# Patient Record
Sex: Female | Born: 1940 | Race: Black or African American | Hispanic: No | Smoking: Never smoker
Health system: Southern US, Community
[De-identification: ages and names within clinical notes are randomized; demographics above are authoritative.]

## PROBLEM LIST (undated history)

## (undated) DIAGNOSIS — M810 Age-related osteoporosis without current pathological fracture: Secondary | ICD-10-CM

## (undated) DIAGNOSIS — I10 Essential (primary) hypertension: Secondary | ICD-10-CM

## (undated) DIAGNOSIS — R202 Paresthesia of skin: Secondary | ICD-10-CM

## (undated) DIAGNOSIS — K219 Gastro-esophageal reflux disease without esophagitis: Secondary | ICD-10-CM

## (undated) HISTORY — DX: Essential (primary) hypertension: I10

## (undated) HISTORY — DX: Paresthesia of skin: R20.2

## (undated) HISTORY — DX: Gastro-esophageal reflux disease without esophagitis: K21.9

## (undated) HISTORY — PX: CATARACT EXTRACTION: SUR2

## (undated) HISTORY — DX: Age-related osteoporosis without current pathological fracture: M81.0

---

## 2005-07-17 ENCOUNTER — Encounter (INDEPENDENT_AMBULATORY_CARE_PROVIDER_SITE_OTHER): Payer: Self-pay | Admitting: Family Medicine

## 2005-07-17 LAB — CONVERTED CEMR LAB

## 2006-07-06 ENCOUNTER — Ambulatory Visit: Payer: Self-pay | Admitting: Family Medicine

## 2006-07-06 LAB — CONVERTED CEMR LAB

## 2006-07-31 ENCOUNTER — Ambulatory Visit (HOSPITAL_COMMUNITY): Admission: RE | Admit: 2006-07-31 | Discharge: 2006-07-31 | Payer: Self-pay | Admitting: Family Medicine

## 2006-11-27 ENCOUNTER — Ambulatory Visit: Payer: Self-pay | Admitting: Family Medicine

## 2007-01-25 ENCOUNTER — Encounter: Payer: Self-pay | Admitting: Family Medicine

## 2007-01-25 DIAGNOSIS — I10 Essential (primary) hypertension: Secondary | ICD-10-CM | POA: Insufficient documentation

## 2007-01-25 DIAGNOSIS — K219 Gastro-esophageal reflux disease without esophagitis: Secondary | ICD-10-CM

## 2007-03-01 DIAGNOSIS — R011 Cardiac murmur, unspecified: Secondary | ICD-10-CM | POA: Insufficient documentation

## 2007-04-01 ENCOUNTER — Ambulatory Visit: Payer: Self-pay | Admitting: Family Medicine

## 2007-08-23 ENCOUNTER — Ambulatory Visit: Payer: Self-pay | Admitting: Internal Medicine

## 2007-08-23 ENCOUNTER — Encounter (INDEPENDENT_AMBULATORY_CARE_PROVIDER_SITE_OTHER): Payer: Self-pay | Admitting: Family Medicine

## 2007-08-23 LAB — CONVERTED CEMR LAB
CO2: 28 meq/L (ref 19–32)
Calcium: 9.5 mg/dL (ref 8.4–10.5)
Chloride: 102 meq/L (ref 96–112)
Creatinine, Ser: 0.76 mg/dL (ref 0.40–1.20)
Glucose, Bld: 88 mg/dL (ref 70–99)

## 2007-08-26 ENCOUNTER — Ambulatory Visit (HOSPITAL_COMMUNITY): Admission: RE | Admit: 2007-08-26 | Discharge: 2007-08-26 | Payer: Self-pay | Admitting: Family Medicine

## 2007-10-19 ENCOUNTER — Ambulatory Visit: Payer: Self-pay | Admitting: Internal Medicine

## 2008-01-13 ENCOUNTER — Encounter (INDEPENDENT_AMBULATORY_CARE_PROVIDER_SITE_OTHER): Payer: Self-pay | Admitting: Family Medicine

## 2008-01-13 ENCOUNTER — Ambulatory Visit: Payer: Self-pay | Admitting: Internal Medicine

## 2008-01-13 LAB — CONVERTED CEMR LAB
Free T4: 1.18 ng/dL (ref 0.89–1.80)
T3 Uptake Ratio: 37 % (ref 22.5–37.0)
TSH: 0.847 microintl units/mL (ref 0.350–4.50)

## 2008-09-05 ENCOUNTER — Ambulatory Visit: Payer: Self-pay | Admitting: Family Medicine

## 2008-09-06 ENCOUNTER — Encounter (INDEPENDENT_AMBULATORY_CARE_PROVIDER_SITE_OTHER): Payer: Self-pay | Admitting: Family Medicine

## 2008-09-06 LAB — CONVERTED CEMR LAB
ALT: 15 units/L (ref 0–35)
Albumin: 4.4 g/dL (ref 3.5–5.2)
Alkaline Phosphatase: 66 units/L (ref 39–117)
CO2: 29 meq/L (ref 19–32)
Glucose, Bld: 72 mg/dL (ref 70–99)
Potassium: 3.9 meq/L (ref 3.5–5.3)
Sodium: 142 meq/L (ref 135–145)
Total Bilirubin: 0.3 mg/dL (ref 0.3–1.2)
Total Protein: 7.3 g/dL (ref 6.0–8.3)

## 2009-06-14 ENCOUNTER — Ambulatory Visit: Payer: Self-pay | Admitting: Family Medicine

## 2009-10-12 ENCOUNTER — Ambulatory Visit: Payer: Self-pay | Admitting: Family Medicine

## 2011-01-24 ENCOUNTER — Encounter: Payer: Self-pay | Admitting: *Deleted

## 2011-01-24 ENCOUNTER — Encounter: Payer: Self-pay | Admitting: Cardiology

## 2011-01-24 ENCOUNTER — Ambulatory Visit (INDEPENDENT_AMBULATORY_CARE_PROVIDER_SITE_OTHER): Payer: Medicare HMO | Admitting: Cardiology

## 2011-01-24 DIAGNOSIS — I10 Essential (primary) hypertension: Secondary | ICD-10-CM

## 2011-01-24 DIAGNOSIS — R011 Cardiac murmur, unspecified: Secondary | ICD-10-CM

## 2011-01-24 DIAGNOSIS — R079 Chest pain, unspecified: Secondary | ICD-10-CM | POA: Insufficient documentation

## 2011-01-24 NOTE — Assessment & Plan Note (Signed)
Probable ejection murmur.

## 2011-01-24 NOTE — Assessment & Plan Note (Signed)
Blood pressure controlled. Continue present medications. 

## 2011-01-24 NOTE — Progress Notes (Signed)
HPI: 70 year old female with no prior cardiac history for evaluation of chest pain. Patient states she has had intermittent chest pain for approximately one year. It is under the left breast and left axilla. It increases with lying on her left side. It is described as a sharp pain. It can last 30 minutes at a time. It does not radiate. It is not pleuritic, related to food or exertion. No associated symptoms. She denies dyspnea on exertion, orthopnea, PND, palpitations, syncope or exertional chest pain. Occasional mild pedal edema. Because of the above we were asked to further evaluate.  Current Outpatient Prescriptions  Medication Sig Dispense Refill  . aspirin 81 MG tablet Take 81 mg by mouth daily.        . carvedilol (COREG) 3.125 MG tablet Take 3.125 mg by mouth 2 (two) times daily with a meal.        . co-enzyme Q-10 30 MG capsule Take 30 mg by mouth 2 (two) times daily.        . Multiple Vitamins-Minerals (MULTIVITAMIN WITH MINERALS) tablet Take 1 tablet by mouth daily.        . nitroGLYCERIN (NITROSTAT) 0.4 MG SL tablet Place 0.4 mg under the tongue every 5 (five) minutes as needed.          No Known Allergies  Past Medical History  Diagnosis Date  . HYPERTENSION   . GERD     Past Surgical History  Procedure Date  . Cataract extraction     History   Social History  . Marital Status: Divorced    Spouse Name: N/A    Number of Children: 3  . Years of Education: N/A   Occupational History  .      retired   Social History Main Topics  . Smoking status: Never Smoker   . Smokeless tobacco: Not on file  . Alcohol Use: No  . Drug Use: Not on file  . Sexually Active: Not on file   Other Topics Concern  . Not on file   Social History Narrative  . No narrative on file    Family History  Problem Relation Age of Onset  . Heart attack Mother     Died at age 19 of MI  . Lung cancer Father     ROS: no fevers or chills, productive cough, hemoptysis, dysphasia,  odynophagia, melena, hematochezia, dysuria, hematuria, rash, seizure activity, orthopnea, PND, pedal edema, claudication. Remaining systems are negative.  Physical Exam: General:  Well developed/well nourished in NAD Skin warm/dry Patient not depressed No peripheral clubbing Back-normal HEENT-normal/normal eyelids Neck supple/normal carotid upstroke bilaterally; no bruits; no JVD; no thyromegaly chest - CTA/ normal expansion CV - RRR/normal S1 and S2; no  rubs or gallops;  PMI nondisplaced; 2/6 systolic murmur left sternal border. Abdomen -NT/ND, no HSM, no mass, + bowel sounds, no bruit 2+ femoral pulses, no bruits Ext-trace edema, no chords, 2+ DP Neuro-grossly nonfocal  ECG 01/23/11 - Sinus rhythm, LAD LVH, Nonspecific ST changes

## 2011-01-24 NOTE — Assessment & Plan Note (Signed)
Symptoms atypical. Schedule stress echo. May be musculoskeletal.

## 2011-01-24 NOTE — Patient Instructions (Signed)
Your physician recommends that you schedule a follow-up appointment in: AS NEEDED  Your physician recommends that you continue on your current medications as directed. Please refer to the Current Medication list given to you today.  Your physician has requested that you have a stress echocardiogram. For further information please visit [email or web address]. Please follow instruction sheet as given. DX CHEST PAIN  

## 2011-02-04 ENCOUNTER — Ambulatory Visit (HOSPITAL_BASED_OUTPATIENT_CLINIC_OR_DEPARTMENT_OTHER): Payer: Medicare HMO | Admitting: Radiology

## 2011-02-04 ENCOUNTER — Ambulatory Visit (HOSPITAL_COMMUNITY): Payer: Medicare HMO | Attending: Cardiology | Admitting: Radiology

## 2011-02-04 DIAGNOSIS — R072 Precordial pain: Secondary | ICD-10-CM

## 2011-02-04 DIAGNOSIS — R5383 Other fatigue: Secondary | ICD-10-CM | POA: Insufficient documentation

## 2011-02-04 DIAGNOSIS — R5381 Other malaise: Secondary | ICD-10-CM | POA: Insufficient documentation

## 2011-02-04 DIAGNOSIS — R0989 Other specified symptoms and signs involving the circulatory and respiratory systems: Secondary | ICD-10-CM

## 2011-02-04 DIAGNOSIS — I10 Essential (primary) hypertension: Secondary | ICD-10-CM | POA: Insufficient documentation

## 2011-08-04 ENCOUNTER — Encounter (HOSPITAL_COMMUNITY): Payer: Self-pay

## 2011-08-04 ENCOUNTER — Emergency Department (HOSPITAL_COMMUNITY)
Admission: EM | Admit: 2011-08-04 | Discharge: 2011-08-04 | Disposition: A | Discharge status: Home / Self Care | Payer: Medicare HMO | Attending: Emergency Medicine | Admitting: Emergency Medicine

## 2011-08-04 DIAGNOSIS — L02519 Cutaneous abscess of unspecified hand: Secondary | ICD-10-CM | POA: Insufficient documentation

## 2011-08-04 DIAGNOSIS — L03019 Cellulitis of unspecified finger: Secondary | ICD-10-CM | POA: Insufficient documentation

## 2011-08-04 DIAGNOSIS — L0291 Cutaneous abscess, unspecified: Secondary | ICD-10-CM

## 2011-08-04 DIAGNOSIS — M79609 Pain in unspecified limb: Secondary | ICD-10-CM | POA: Insufficient documentation

## 2011-08-04 DIAGNOSIS — I10 Essential (primary) hypertension: Secondary | ICD-10-CM | POA: Insufficient documentation

## 2011-08-04 MED ORDER — CEPHALEXIN 250 MG PO CAPS
500.0000 mg | ORAL_CAPSULE | Freq: Once | ORAL | Status: AC
Start: 2011-08-04 — End: 2011-08-04
  Administered 2011-08-04: 500 mg via ORAL
  Filled 2011-08-04: qty 2

## 2011-08-04 MED ORDER — CEPHALEXIN 500 MG PO CAPS: 500.0000 mg | ORAL_CAPSULE | Freq: Four times a day (QID) | ORAL | Status: DC

## 2011-08-04 NOTE — Discharge Instructions (Signed)
Keep your wound clean and dry.  He may wash with soap and water.  Use Keflex for infection and Tylenol or Motrin to reduce pain and swelling.  Followup to Dr. as needed.  Return for worse or uncontrolled symptoms

## 2011-08-04 NOTE — ED Provider Notes (Signed)
History     CSN: [id number]  Arrival date & time 08/04/11  4098   First MD Initiated Contact with Patient 08/04/11 1137      Chief Complaint  Patient presents with  . Finger Injury    (Consider location/radiation/quality/duration/timing/severity/associated sxs/prior treatment) The history is provided by the patient.   the patient is a 71 year old, right-hand dominant female, with a history of hypertension, who presents emergency department with right thumb pain and swelling for several days.  She denies trauma.  She washes dishes by hand, but uses gloves.  She has not had fevers, chills, nausea, vomiting.  Past Medical History  Diagnosis Date  . HYPERTENSION   . GERD     Past Surgical History  Procedure Date  . Cataract extraction     Family History  Problem Relation Age of Onset  . Heart attack Mother     Died at age 9 of MI  . Lung cancer Father     History  Substance Use Topics  . Smoking status: Never Smoker   . Smokeless tobacco: Not on file  . Alcohol Use: No    OB History    Grav Para Term Preterm Abortions TAB SAB Ect Mult Living                  Review of Systems  Constitutional: Negative for fever and chills.  Gastrointestinal: Negative for nausea and vomiting.  Musculoskeletal:       Right thumb pain, and  Skin: Negative for rash.  Neurological: Negative for weakness.    Allergies  Review of patient's allergies indicates no known allergies.  Home Medications   Current Outpatient Rx  Name Route Sig Dispense Refill  . IBUPROFEN 200 MG PO TABS Oral Take 200 mg by mouth every 6 (six) hours as needed. For pain    . LISINOPRIL-HYDROCHLOROTHIAZIDE 20-25 MG PO TABS Oral Take 1 tablet by mouth daily.    Marland Kitchen NITROGLYCERIN 0.4 MG SL SUBL Sublingual Place 0.4 mg under the tongue every 5 (five) minutes as needed. For chest pain      BP 151/82  Pulse 80  Temp(Src) 97.8 F (36.6 C) (Oral)  Resp 20  SpO2 98%  Physical Exam  Vitals  reviewed. Constitutional: She appears well-developed and well-nourished.  HENT:  Head: Normocephalic and atraumatic.  Eyes: Conjunctivae are normal. Pupils are equal, round, and reactive to light.  Neck: Normal range of motion. Neck supple.  Pulmonary/Chest: Effort normal.  Musculoskeletal: She exhibits edema and tenderness.       Right thumb with boggy swelling and tenderness just proximal to the root of the nail between the IP joint and the nail.  There is no tenderness to the volar surface of the thumb.  Neurological: She is alert.  Skin: Skin is warm and dry. No erythema.  Psychiatric: She has a normal mood and affect. Thought content normal.    ED Course  Procedures (including critical care time) Abscess to the dorsum of the right thumb.  I anesthetized her with 2% lidocaine without epinephrine, and made an incision with an 18-gauge needle and expressed a small amount of pus.  The patient tolerated the procedure well.  Labs Reviewed - No data to display No results found.   No diagnosis found.    MDM  Right thumb abscess, drained in the emergency department, tolerated well by the patient        Cheri Guppy, MD 08/04/11 1209

## 2011-08-04 NOTE — ED Notes (Signed)
Onset 4 days ago right thumb and right hand swelling increases then improved however swelling right thumb +2 radial pulses +2 strong pain throbbing 7-10/10.  Patient has ring on 4th finger instructed patient to removed ring. Removed without incident.  Small tiny blister distal thumb below nail bed no drainage present patient stated have been then present for one year.

## 2011-08-04 NOTE — ED Notes (Signed)
Left thumb swelling and painful, redness noted.

## 2011-08-07 ENCOUNTER — Encounter (HOSPITAL_COMMUNITY): Payer: Self-pay | Admitting: Emergency Medicine

## 2011-08-07 ENCOUNTER — Emergency Department (HOSPITAL_COMMUNITY)
Admission: EM | Admit: 2011-08-07 | Discharge: 2011-08-07 | Discharge status: Against Medical Advice | Payer: Medicare HMO | Attending: Emergency Medicine | Admitting: Emergency Medicine

## 2011-08-07 DIAGNOSIS — M79609 Pain in unspecified limb: Secondary | ICD-10-CM | POA: Insufficient documentation

## 2011-08-07 NOTE — ED Notes (Signed)
Pt states she is leaving, encouraged pt to stay and be seen.  Pt states she has appt on Monday.

## 2011-08-07 NOTE — ED Notes (Signed)
Called patient x 3 or 4 times and there was no answer. Nurse is aware

## 2011-08-07 NOTE — ED Notes (Signed)
Was seen here on Monday for infection on rt thumb cannot get appointment fot a while and needs a recheck has taken her meds as she should

## 2012-11-23 ENCOUNTER — Encounter: Payer: Self-pay | Admitting: Internal Medicine

## 2012-11-25 ENCOUNTER — Other Ambulatory Visit (HOSPITAL_COMMUNITY): Payer: Self-pay | Admitting: Obstetrics

## 2012-11-25 ENCOUNTER — Other Ambulatory Visit (HOSPITAL_COMMUNITY): Payer: Self-pay | Admitting: Family Medicine

## 2012-11-25 DIAGNOSIS — Z1231 Encounter for screening mammogram for malignant neoplasm of breast: Secondary | ICD-10-CM

## 2012-11-25 DIAGNOSIS — M810 Age-related osteoporosis without current pathological fracture: Secondary | ICD-10-CM

## 2012-12-21 ENCOUNTER — Ambulatory Visit (HOSPITAL_COMMUNITY)
Admission: RE | Admit: 2012-12-21 | Discharge: 2012-12-21 | Disposition: A | Discharge status: Home / Self Care | Payer: Medicare HMO | Source: Ambulatory Visit | Attending: Family Medicine | Admitting: Family Medicine

## 2012-12-21 DIAGNOSIS — Z13820 Encounter for screening for osteoporosis: Secondary | ICD-10-CM | POA: Insufficient documentation

## 2012-12-21 DIAGNOSIS — Z1231 Encounter for screening mammogram for malignant neoplasm of breast: Secondary | ICD-10-CM

## 2012-12-21 DIAGNOSIS — M810 Age-related osteoporosis without current pathological fracture: Secondary | ICD-10-CM

## 2012-12-21 DIAGNOSIS — Z780 Asymptomatic menopausal state: Secondary | ICD-10-CM | POA: Insufficient documentation

## 2012-12-22 ENCOUNTER — Other Ambulatory Visit: Payer: Self-pay | Admitting: Family Medicine

## 2012-12-22 DIAGNOSIS — R928 Other abnormal and inconclusive findings on diagnostic imaging of breast: Secondary | ICD-10-CM

## 2013-01-06 ENCOUNTER — Other Ambulatory Visit: Payer: Self-pay | Admitting: Cardiology

## 2013-01-07 ENCOUNTER — Ambulatory Visit
Admission: RE | Admit: 2013-01-07 | Discharge: 2013-01-07 | Disposition: A | Discharge status: Home / Self Care | Payer: Medicare HMO | Source: Ambulatory Visit | Attending: Family Medicine | Admitting: Family Medicine

## 2013-01-07 ENCOUNTER — Other Ambulatory Visit: Payer: Self-pay | Admitting: Family Medicine

## 2013-01-07 DIAGNOSIS — R928 Other abnormal and inconclusive findings on diagnostic imaging of breast: Secondary | ICD-10-CM

## 2013-01-13 ENCOUNTER — Ambulatory Visit (AMBULATORY_SURGERY_CENTER): Payer: Medicare HMO

## 2013-01-13 VITALS — Ht 62.0 in | Wt 123.0 lb

## 2013-01-13 DIAGNOSIS — Z1211 Encounter for screening for malignant neoplasm of colon: Secondary | ICD-10-CM

## 2013-01-13 MED ORDER — MOVIPREP 100 G PO SOLR: 1.0000 | Freq: Once | ORAL | Status: DC

## 2013-01-14 ENCOUNTER — Encounter: Payer: Self-pay | Admitting: Internal Medicine

## 2013-01-26 ENCOUNTER — Telehealth: Payer: Self-pay | Admitting: Internal Medicine

## 2013-01-27 ENCOUNTER — Encounter: Payer: Medicare HMO | Admitting: Internal Medicine

## 2013-01-27 NOTE — Telephone Encounter (Signed)
Yes, charge cancel fee

## 2013-01-27 NOTE — Telephone Encounter (Signed)
Charge cancellation fee per Dr Rhea Belton.

## 2013-01-28 ENCOUNTER — Encounter: Payer: Self-pay | Admitting: Internal Medicine

## 2013-02-02 NOTE — Telephone Encounter (Signed)
Unable to [employer] Fee due to her Insurance/yf

## 2013-08-22 ENCOUNTER — Ambulatory Visit: Payer: Self-pay | Admitting: Family Medicine

## 2013-09-07 ENCOUNTER — Ambulatory Visit: Payer: Commercial Managed Care - HMO

## 2013-09-07 ENCOUNTER — Ambulatory Visit: Payer: Commercial Managed Care - HMO | Admitting: Family Medicine

## 2013-09-07 VITALS — BP 160/88 | HR 74 | Temp 98.3°F | Resp 16 | Ht 59.0 in | Wt 121.4 lb

## 2013-09-07 DIAGNOSIS — R062 Wheezing: Secondary | ICD-10-CM

## 2013-09-07 DIAGNOSIS — R05 Cough: Secondary | ICD-10-CM

## 2013-09-07 DIAGNOSIS — R059 Cough, unspecified: Secondary | ICD-10-CM

## 2013-09-07 DIAGNOSIS — I10 Essential (primary) hypertension: Secondary | ICD-10-CM

## 2013-09-07 DIAGNOSIS — J45909 Unspecified asthma, uncomplicated: Secondary | ICD-10-CM

## 2013-09-07 MED ORDER — AZITHROMYCIN 250 MG PO TABS: ORAL_TABLET | ORAL | Status: DC

## 2013-09-07 MED ORDER — BENZONATATE 100 MG PO CAPS: 100.0000 mg | ORAL_CAPSULE | Freq: Three times a day (TID) | ORAL | Status: DC | PRN

## 2013-09-07 MED ORDER — ALBUTEROL SULFATE (2.5 MG/3ML) 0.083% IN NEBU
2.5000 mg | INHALATION_SOLUTION | Freq: Once | RESPIRATORY_TRACT | Status: AC
  Administered 2013-09-07: 2.5 mg via RESPIRATORY_TRACT

## 2013-09-07 MED ORDER — ALBUTEROL SULFATE HFA 108 (90 BASE) MCG/ACT IN AERS: 2.0000 | INHALATION_SPRAY | Freq: Four times a day (QID) | RESPIRATORY_TRACT | Status: DC | PRN

## 2013-09-07 NOTE — Progress Notes (Signed)
Subjective: Patient comes in today with a one-month history of cough and bringing up green and yellow phlegm. She has persisted in having problems, only feeling a little better the last few days. She was concerned about her blood pressure medicine being the cause of cough. She does not smoke. The coughing is been all day long. She does not have a history of a lot of allergies.  Objective: Blood pressure is little up she did quit her medicines last few days to see about getting rid of her cough apparently. Her TMs are normal. Throat clear. Neck supple without significant nodes. Chest has diffuse rhonchi and wheezing, more on the left than the right. Heart regular without murmur.  Assessment: Bronchitis Cough Wheezing Hypertension  Plan: Albuterol Chest x-ray  [facility] reading (PRIMARY) by  Dr. Alwyn RenHopper No acute changes.

## 2013-09-07 NOTE — Patient Instructions (Addendum)
Drink plenty of fluids  Continue current blood pressure medicine  Use the inhaler 2 inhalations every 4-6 hours as needed for coughing and wheezing  Take the cough pills one or 2 pills 3 times daily as needed for cough  Return if worse or not improving

## 2013-12-13 ENCOUNTER — Ambulatory Visit (INDEPENDENT_AMBULATORY_CARE_PROVIDER_SITE_OTHER): Payer: Commercial Managed Care - HMO | Admitting: Emergency Medicine

## 2013-12-13 VITALS — BP 138/86 | HR 58 | Temp 97.9°F | Resp 16 | Ht 61.5 in | Wt 122.4 lb

## 2013-12-13 DIAGNOSIS — G5600 Carpal tunnel syndrome, unspecified upper limb: Secondary | ICD-10-CM

## 2013-12-13 DIAGNOSIS — G5602 Carpal tunnel syndrome, left upper limb: Secondary | ICD-10-CM

## 2013-12-13 NOTE — Patient Instructions (Signed)
Carpal Tunnel Syndrome The carpal tunnel is a narrow area located on the palm side of your wrist. The tunnel is formed by the wrist bones and ligaments. Nerves, blood vessels, and tendons pass through the carpal tunnel. Repeated wrist motion or certain diseases may cause swelling within the tunnel. This swelling pinches the main nerve in the wrist (median nerve) and causes the painful hand and arm condition called carpal tunnel syndrome. CAUSES   Repeated wrist motions.  Wrist injuries.  Certain diseases like arthritis, diabetes, alcoholism, hyperthyroidism, and kidney failure.  Obesity.  Pregnancy. SYMPTOMS   A "pins and needles" feeling in your fingers or hand, especially in your thumb, index and middle fingers.  Tingling or numbness in your fingers or hand.  An aching feeling in your entire arm, especially when your wrist and elbow are bent for long periods of time.  Wrist pain that goes up your arm to your shoulder.  Pain that goes down into your palm or fingers.  A weak feeling in your hands. DIAGNOSIS  Your health care provider will take your history and perform a physical exam. An electromyography test may be needed. This test measures electrical signals sent out by your nerves into the muscles. The electrical signals are usually slowed by carpal tunnel syndrome. You may also need X-rays. TREATMENT  Carpal tunnel syndrome may clear up by itself. Your health care provider may recommend a wrist splint or medicine such as a nonsteroidal anti-inflammatory medicine. Cortisone injections may help. Sometimes, surgery may be needed to free the pinched nerve.  HOME CARE INSTRUCTIONS   Take all medicine as directed by your health care provider. Only take over-the-counter or prescription medicines for pain, discomfort, or fever as directed by your health care provider.  If you were given a splint to keep your wrist from bending, wear it as directed. It is important to wear the splint at  night. Wear the splint for as long as you have pain or numbness in your hand, arm, or wrist. This may take 1 to 2 months.  Rest your wrist from any activity that may be causing your pain. If your symptoms are work-related, you may need to talk to your employer about changing to a job that does not require using your wrist.  Put ice on your wrist after long periods of wrist activity.  Put ice in a plastic bag.  Place a towel between your skin and the bag.  Leave the ice on for 15-20 minutes, 03-04 times a day.  Keep all follow-up visits as directed by your health care provider. This includes any orthopedic referrals, physical therapy, and rehabilitation. Any delay in getting necessary care could result in a delay or failure of your condition to heal. SEEK IMMEDIATE MEDICAL CARE IF:   You have new, unexplained symptoms.  Your symptoms get worse and are not helped or controlled with medicines. MAKE SURE YOU:   Understand these instructions.  Will watch your condition.  Will get help right away if you are not doing well or get worse. Document Released: 05/02/2000 Document Revised: 09/19/2013 Document Reviewed: 03/21/2011 Exit[employer] Patient Information 2015 [employer]. This information is not intended to replace advice given to you by your health care provider. Make sure you discuss any questions you have with your health care provider.  

## 2013-12-13 NOTE — Progress Notes (Signed)
[facility] [address], [place] [address] [phone]  Date:  12/13/2013   Name:  Gloria Navarro   DOB:  1941-04-28   MRN:  [id number]  PCP:  Becky Sax, MD    Chief Complaint: Numbness and Tingling   History of Present Illness:  Gloria Navarro is a 73 y.o. very pleasant female patient who presents with the following:  Patient has a several month duration numbness in her left hand fingers.  Says initially it only bothered her at night and woke her up with pain and numbness. Now has numbness all of the time.  Says she was forced to give up sewing and knitting as a consequence  No weakness or dropping.  No pain in neck or arm.  No improvement with over the counter medications or other home remedies. Denies other complaint or health concern today.   Patient Active Problem List   Diagnosis Date Noted  . Chest pain 01/24/2011  . Hypertension 01/24/2011  . CARDIAC MURMUR 03/01/2007  . HYPERTENSION 01/25/2007  . GERD 01/25/2007    Past Medical History  Diagnosis Date  . HYPERTENSION   . GERD     Past Surgical History  Procedure Laterality Date  . Cataract extraction      History  Substance Use Topics  . Smoking status: Never Smoker   . Smokeless tobacco: Not on file  . Alcohol Use: No    Family History  Problem Relation Age of Onset  . Heart attack Mother     Died at age 76 of MI  . Lung cancer Father   . Colon cancer Neg Hx   . Stomach cancer Neg Hx     No Known Allergies  Medication list has been reviewed and updated.  Current Outpatient Prescriptions on File Prior to Visit  Medication Sig Dispense Refill  . hydrALAZINE (APRESOLINE) 50 MG tablet Take 50 mg by mouth 3 (three) times daily.      Marland Kitchen albuterol (PROVENTIL HFA;VENTOLIN HFA) 108 (90 BASE) MCG/ACT inhaler Inhale 2 puffs into the lungs every 6 (six) hours as needed for wheezing or shortness of breath.  1 Inhaler  0  . azithromycin (ZITHROMAX) 250 MG tablet Take 2 initially,  then one daily for 4 days for infection  6 tablet  0  . benzonatate (TESSALON) 100 MG capsule Take 1-2 capsules (100-200 mg total) by mouth 3 (three) times daily as needed for cough.  30 capsule  0  . meloxicam (MOBIC) 7.5 MG tablet Take 7.5 mg by mouth as needed for pain.      Marland Kitchen MOVIPREP 100 G SOLR Take 1 kit (200 g total) by mouth once.  1 kit  0  . [DISCONTINUED] carvedilol (COREG) 3.125 MG tablet Take 3.125 mg by mouth 2 (two) times daily with a meal.         No current facility-administered medications on file prior to visit.    Review of Systems:  As per HPI, otherwise negative.    Physical Examination: Filed Vitals:   12/13/13 1329  BP: 138/86  Pulse: 58  Temp: 97.9 F (36.6 C)  Resp: 16   Filed Vitals:   12/13/13 1329  Height: 5' 1.5" (1.562 m)  Weight: 122 lb 6.4 oz (55.52 kg)   Body mass index is 22.76 kg/(m^2). Ideal Body Weight: Weight in (lb) to have BMI = 25: 134.2   GEN: WDWN, NAD, Non-toxic, Alert & Oriented x 3 HEENT: Atraumatic, Normocephalic.  Ears and  Nose: No external deformity. EXTR: No clubbing/cyanosis/edema NEURO: Normal gait.  PSYCH: Normally interactive. Conversant. Not depressed or anxious appearing.  Calm demeanor.  LEFT hand:  Normal grip.  No wasting.  tinnel and phalen positive  Assessment and Plan: Carpal tunnel NCS EMG Night splint  Signed,  Ellison Carwin, MD

## 2013-12-28 ENCOUNTER — Other Ambulatory Visit: Payer: Self-pay

## 2014-02-03 ENCOUNTER — Other Ambulatory Visit: Payer: Self-pay | Admitting: Family Medicine

## 2014-02-27 ENCOUNTER — Ambulatory Visit (INDEPENDENT_AMBULATORY_CARE_PROVIDER_SITE_OTHER): Payer: 59

## 2014-02-27 ENCOUNTER — Ambulatory Visit (INDEPENDENT_AMBULATORY_CARE_PROVIDER_SITE_OTHER): Payer: 59 | Admitting: Family Medicine

## 2014-02-27 VITALS — BP 126/82 | HR 55 | Temp 98.2°F | Resp 16 | Ht 61.75 in | Wt 123.0 lb

## 2014-02-27 DIAGNOSIS — R208 Other disturbances of skin sensation: Secondary | ICD-10-CM

## 2014-02-27 DIAGNOSIS — M25462 Effusion, left knee: Secondary | ICD-10-CM

## 2014-02-27 DIAGNOSIS — R200 Anesthesia of skin: Secondary | ICD-10-CM

## 2014-02-27 DIAGNOSIS — H578 Other specified disorders of eye and adnexa: Secondary | ICD-10-CM

## 2014-02-27 DIAGNOSIS — H5789 Other specified disorders of eye and adnexa: Secondary | ICD-10-CM

## 2014-02-27 MED ORDER — MELOXICAM 7.5 MG PO TABS: 7.5000 mg | ORAL_TABLET | ORAL | Status: DC | PRN

## 2014-02-27 NOTE — Progress Notes (Signed)
[facility] [address] [place] [address] [phone]  Date:  02/27/2014   Name:  Gloria Navarro   DOB:  January 25, 1941   MRN:  [id number]  PCP:  Becky Sax, MD    Chief Complaint: Knee Pain, Left Hand and Red Eyes   History of Present Illness:  Gloria Navarro is a 73 y.o. very pleasant female patient who presents with the following:  Here today with a few concerns.  She has noted left knee pain for a couple of weeks. She cannot remember any particular injury. It just seemed to start on its own after she did a good bit of gardening.  It had been swollen and red, but this is now actually a lot better.  However it does click and pop sometimes.  She has "never" had trouble with her knee in the past.    Also, she has noted numbness in her left hand for several months.  She was seen for this in late July, but seems to think it was related to her BP at that time.  She did not ever use a wrist splint or have an nerve conduction study; it looked like that was the plan at that time.  She notes the numbness more in the 3rd, 4th, 5th fingers.    She also notes redness in her right eye for the last couple of weeks.  No change in vision, no pain or discharge.  Admits that she started using visine daily and has done so for a couple of weeks.   Patient Active Problem List   Diagnosis Date Noted  . Chest pain 01/24/2011  . Hypertension 01/24/2011  . CARDIAC MURMUR 03/01/2007  . HYPERTENSION 01/25/2007  . GERD 01/25/2007    Past Medical History  Diagnosis Date  . HYPERTENSION   . GERD     Past Surgical History  Procedure Laterality Date  . Cataract extraction      History  Substance Use Topics  . Smoking status: Never Smoker   . Smokeless tobacco: Not on file  . Alcohol Use: No    Family History  Problem Relation Age of Onset  . Heart attack Mother     Died at age 78 of MI  . Lung cancer Father   . Colon cancer Neg Hx   . Stomach cancer Neg Hx      No Known Allergies  Medication list has been reviewed and updated.  Current Outpatient Prescriptions on File Prior to Visit  Medication Sig Dispense Refill  . albuterol (PROVENTIL HFA;VENTOLIN HFA) 108 (90 BASE) MCG/ACT inhaler Inhale 2 puffs into the lungs every 6 (six) hours as needed for wheezing or shortness of breath.  1 Inhaler  0  . hydrALAZINE (APRESOLINE) 50 MG tablet Take 50 mg by mouth 3 (three) times daily.      . meloxicam (MOBIC) 7.5 MG tablet Take 7.5 mg by mouth as needed for pain.      Marland Kitchen MOVIPREP 100 G SOLR Take 1 kit (200 g total) by mouth once.  1 kit  0  . azithromycin (ZITHROMAX) 250 MG tablet Take 2 initially, then one daily for 4 days for infection  6 tablet  0  . benzonatate (TESSALON) 100 MG capsule Take 1-2 capsules (100-200 mg total) by mouth 3 (three) times daily as needed for cough.  30 capsule  0  . [DISCONTINUED] carvedilol (COREG) 3.125 MG tablet Take 3.125 mg by mouth 2 (two) times daily with a  meal.         No current facility-administered medications on file prior to visit.    Review of Systems:  As per HPI- otherwise negative.   Physical Examination: Filed Vitals:   02/27/14 1535  BP: 126/82  Pulse: 55  Temp: 98.2 F (36.8 C)  Resp: 16   Filed Vitals:   02/27/14 1535  Height: 5' 1.75" (1.568 m)  Weight: 123 lb (55.792 kg)   Body mass index is 22.69 kg/(m^2). Ideal Body Weight: Weight in (lb) to have BMI = 25: 135.3  GEN: WDWN, NAD, Non-toxic, A & O x 3, older lady who looks well HEENT: Atraumatic, Normocephalic. Neck supple. No masses, No LAD.  Bilateral TM wnl, oropharynx normal.  PEERL,EOMI.   Slight injection of her right eye, normal fundoscopic exam Ears and Nose: No external deformity. CV: RRR, No M/G/R. No JVD. No thrill. No extra heart sounds. PULM: CTA B, no wheezes, crackles, rhonchi. No retractions. No resp. distress. No accessory muscle use. EXTR: No c/c/e NEURO Normal gait.  PSYCH: Normally interactive. Conversant.  Not depressed or anxious appearing.  Calm demeanor.  Left knee: small effusion, full ROM. Mild lateral joint line tenderness. No redness, slight warmth.  Both hands show normal strength, sensation, and perfusion.    [facility] reading (PRIMARY) by  Dr. Lorelei Pont. Left knee: negative except for small effusion.   LEFT KNEE - COMPLETE 4+ VIEW  COMPARISON: None.  FINDINGS: There is no fracture. The patient has a very small joint effusion. Mild patellofemoral degenerative change is noted.  IMPRESSION: No acute finding.  Mild patellofemoral degenerative disease.  Small joint effusion.  Assessment and Plan: Knee effusion, left - Plan: DG Knee Complete 4 Views Left, meloxicam (MOBIC) 7.5 MG tablet  Eye redness  Hand numbness  Counseled that her eye redness is likely due to overuse of visine drops.  She will change to eye moisturizer and natural tears drops.   Hand numbness.  Suspect CTS. Gave a wrist splint to use at night.  If not helpful she will let me know Knee effusion: suspect she injured her knee while gardening.  She declined to have me aspirate her knee today.   She has used mobic in the past- will use this as needed, and wear hinged knee brace as necessary.    Signed Lamar Blinks, MD

## 2014-02-27 NOTE — Patient Instructions (Addendum)
You are likely having some eye redness due to overuse of visine drops.  Stop using any drops "for redness" and use only a dry eye treatment such as genteal dry eye gel or a "natural tears" drops.    Try wearing the wrist brace on your hand at night to see if it may help with your finger numbness.  If it does not please let us know!  Your knee does show some fluid in the joint.  I suspect you injured it somehow when you were gardening.  As long as it is getting better you do not have to do anything further, but if it does continue to bother you let us know.   You can use the meloxicam once a day as needed also

## 2014-03-01 ENCOUNTER — Ambulatory Visit (INDEPENDENT_AMBULATORY_CARE_PROVIDER_SITE_OTHER): Payer: 59 | Admitting: Family Medicine

## 2014-03-01 VITALS — BP 156/88 | HR 59 | Temp 98.2°F | Resp 16 | Ht 61.75 in | Wt 124.0 lb

## 2014-03-01 DIAGNOSIS — M25562 Pain in left knee: Secondary | ICD-10-CM

## 2014-03-01 DIAGNOSIS — M1732 Unilateral post-traumatic osteoarthritis, left knee: Secondary | ICD-10-CM

## 2014-03-01 NOTE — Patient Instructions (Signed)
You received a shot of steroid and numbing medicine into your left knee today.  I hope that this will help your knee to feel better. If you do not notice improvement over the next week or so call and I will refer you to orthopedics.  If you have any sign of infection such as redness, increased pain or swelling please seek care right away!

## 2014-03-01 NOTE — Progress Notes (Signed)
[facility] [address], [place] [address] [phone]  Date:  03/01/2014   Name:  Gloria GuadalajaraRuby Linford   DOB:  03/04/1941   MRN:  [id number]  PCP:  Tommie RaymondWilson, Amelia P, MD    Chief Complaint: Follow-up   History of Present Illness:  Gloria GuadalajaraRuby Mario is a 73 y.o. very pleasant female patient who presents with the following:  She was here 2 days ago with pain in her left knee.  Seemed to result for over-use; gardening.   X-ray showed a small effusion.  Started on mobic and given a hinged knee brace.  She is here today because after consideration she would like to have her knee aspirated and injected with cortisone.  She does not have any allergies to medictions, no diabetes.  She has never had a knee injection or surgery in the past.   She is aware of small risk of infection from any joint injection or aspiration  Patient Active Problem List   Diagnosis Date Noted  . Chest pain 01/24/2011  . Hypertension 01/24/2011  . CARDIAC MURMUR 03/01/2007  . HYPERTENSION 01/25/2007  . GERD 01/25/2007    Past Medical History  Diagnosis Date  . HYPERTENSION   . GERD     Past Surgical History  Procedure Laterality Date  . Cataract extraction      History  Substance Use Topics  . Smoking status: Never Smoker   . Smokeless tobacco: Not on file  . Alcohol Use: No    Family History  Problem Relation Age of Onset  . Heart attack Mother     Died at age 75 of MI  . Lung cancer Father   . Colon cancer Neg Hx   . Stomach cancer Neg Hx     No Known Allergies  Medication list has been reviewed and updated.  Current Outpatient Prescriptions on File Prior to Visit  Medication Sig Dispense Refill  . meloxicam (MOBIC) 7.5 MG tablet Take 1 tablet (7.5 mg total) by mouth as needed for pain.  30 tablet  0  . [DISCONTINUED] carvedilol (COREG) 3.125 MG tablet Take 3.125 mg by mouth 2 (two) times daily with a meal.         No current facility-administered medications on file  prior to visit.    Review of Systems:  As per HPI- otherwise negative.   Physical Examination: Filed Vitals:   03/01/14 1533  BP: 156/88  Pulse: 59  Temp: 98.2 F (36.8 C)  Resp: 16   Filed Vitals:   03/01/14 1533  Height: 5' 1.75" (1.568 m)  Weight: 124 lb (56.246 kg)   Body mass index is 22.88 kg/(m^2). Ideal Body Weight: Weight in (lb) to have BMI = 25: 135.3   GEN: WDWN, NAD, Non-toxic, Alert & Oriented x 3 HEENT: Atraumatic, Normocephalic.  Ears and Nose: No external deformity. EXTR: No clubbing/cyanosis/edema NEURO: Normal gait.  PSYCH: Normally interactive. Conversant. Not depressed or anxious appearing.  Calm demeanor.  Left knee: moderate effusion with medial joint line tenderness.  Normal ROM. No heat or redness  VC obtained.  Left knee prepped with betadine and let dry, then wiped with alcohol.  approx 5ml of 1% lidocaine placed in the track that I planned to use to aspirate the joint.  Then inserted an 18 gauge needle and aspirated 20ml of clear, serous fluid from the knee, then injected 40mg  of depomedrol mixed with 4ml of 1% lidocaine.  Pt tolerated well, no blood loss.  Assessment and Plan: Left knee pain  Post-traumatic osteoarthritis of left knee  Aspirated effusion and placed depo-medrol in painful knee for her today.  She will continue to use her hinged knee brace as needed and will follow-up if sx persist.  Discussed warning signs of infection to watch out for.    Signed Abbe AmsterdamJessica Copland, MD

## 2014-05-03 ENCOUNTER — Ambulatory Visit (INDEPENDENT_AMBULATORY_CARE_PROVIDER_SITE_OTHER): Payer: 59 | Admitting: Family Medicine

## 2014-05-03 VITALS — BP 160/100 | HR 66 | Temp 98.3°F | Resp 18 | Ht 62.0 in | Wt 124.0 lb

## 2014-05-03 DIAGNOSIS — R0789 Other chest pain: Secondary | ICD-10-CM

## 2014-05-03 DIAGNOSIS — I10 Essential (primary) hypertension: Secondary | ICD-10-CM

## 2014-05-03 LAB — TROPONIN I: TROPONIN I: 0.01 ng/mL (ref <0.06)

## 2014-05-03 MED ORDER — LISINOPRIL-HYDROCHLOROTHIAZIDE 10-12.5 MG PO TABS: 1.0000 | ORAL_TABLET | Freq: Every day | ORAL | Status: DC

## 2014-05-03 NOTE — Patient Instructions (Signed)
Start back on lisinopril/hctz one a day.  Also take a baby aspirin daily.  We will get you back in to see your cardiologist If your troponin level is high we will have you seek immediate care at the ER- I will call you Let me know if any changes in your chest pain in the meantime and please come in for a blood pressure check in 1 month.

## 2014-05-03 NOTE — Progress Notes (Signed)
[facility] [address], [place] [address] [phone]  Date:  05/03/2014   Name:  Gloria Navarro   DOB:  12/07/1940   MRN:  [id number]  PCP:  Tommie RaymondWilson, Amelia P, MD    Chief Complaint: Hypertension   History of Present Illness:  Gloria Navarro is a 73 y.o. very pleasant female patient who presents with the following:  histoyr of HTN.  Here today with concern about her BP- she does check her BP at home.  She has been getting readings around 160/100 at home and got worried. He had been on coreg in the past, more recnetly on prinzide.  She has not taken anything in about 2 months and is not sure what she was taking most recently.   She has had HTN for many years- she can't remember how long exactlay. She also mentions that she has noted some CP over the last month.  Occurs "If I'm in a hurry or upset or something."  It may last about 3 minutes and is not present now.   She had some trouble with chest pain 3 years ago and underwent a stress echo- this looked ok.   At that time she was on coreg.  She saw Dr. Jens Somrenshaw briefly.    Called her drug store and it sounds like she was most recently on hydralazine  BP Readings from Last 3 Encounters:  05/03/14 160/100  03/01/14 156/88  02/27/14 126/82     Patient Active Problem List   Diagnosis Date Noted  . Chest pain 01/24/2011  . Hypertension 01/24/2011  . CARDIAC MURMUR 03/01/2007  . HYPERTENSION 01/25/2007  . GERD 01/25/2007    Past Medical History  Diagnosis Date  . HYPERTENSION   . GERD     Past Surgical History  Procedure Laterality Date  . Cataract extraction      History  Substance Use Topics  . Smoking status: Never Smoker   . Smokeless tobacco: Not on file  . Alcohol Use: No    Family History  Problem Relation Age of Onset  . Heart attack Mother     Died at age 75 of MI  . Lung cancer Father   . Colon cancer Neg Hx   . Stomach cancer Neg Hx     No Known Allergies  Medication  list has been reviewed and updated.  Current Outpatient Prescriptions on File Prior to Visit  Medication Sig Dispense Refill  . meloxicam (MOBIC) 7.5 MG tablet Take 1 tablet (7.5 mg total) by mouth as needed for pain. (Patient not taking: Reported on 05/03/2014) 30 tablet 0  . [DISCONTINUED] carvedilol (COREG) 3.125 MG tablet Take 3.125 mg by mouth 2 (two) times daily with a meal.       No current facility-administered medications on file prior to visit.    Review of Systems:  As per HPI- otherwise negative.   Physical Examination: Filed Vitals:   05/03/14 1324  BP: 160/100  Pulse: 66  Temp: 98.3 F (36.8 C)  Resp: 18   Filed Vitals:   05/03/14 1324  Height: 5\' 2"  (1.575 m)  Weight: 124 lb (56.246 kg)   Body mass index is 22.67 kg/(m^2). Ideal Body Weight: Weight in (lb) to have BMI = 25: 136.4  GEN: WDWN, NAD, Non-toxic, A & O x 3, looks well HEENT: Atraumatic, Normocephalic. Neck supple. No masses, No LAD. Ears and Nose: No external deformity. CV: RRR, No M/G/R. No JVD. No thrill. No extra heart  sounds. PULM: CTA B, no wheezes, crackles, rhonchi. No retractions. No resp. distress. No accessory muscle use. EXTR: No c/c/e NEURO Normal gait.  PSYCH: Normally interactive. Conversant. Not depressed or anxious appearing.  Calm demeanor.   EKG:  SR, non -specific elevation V1/2.  [facility]  They also do not have any old EKG easily accessible.  As she does not have current CP will plan to check a troponin and assuming it is negative she can follow-up as an outpt.  Assessment and Plan: Essential hypertension - Plan: Comprehensive metabolic panel, lisinopril-hydrochlorothiazide (PRINZIDE,ZESTORETIC) 10-12.5 MG per tablet, Ambulatory referral to Cardiology  Other chest pain - Plan: EKG 12-Lead, Troponin I, Ambulatory referral to Cardiology  Discussed with pt in detail.  Will start back on lisinopril/ hctz as opposed to a BB due to her slow rate.   She  will take a baby asa daily, referral back to cardiology  Meds ordered this encounter  Medications  . lisinopril-hydrochlorothiazide (PRINZIDE,ZESTORETIC) 10-12.5 MG per tablet    Sig: Take 1 tablet by mouth daily.    Dispense:  90 tablet    Refill:  3    Called in the evening with her troponin: Results for orders placed or performed in visit on 05/03/14  Troponin I  Result Value Ref Range   Troponin I 0.01 <0.06 ng/mL   It is negative.  She is relieved and will seek care if any change in her symptoms.    Signed Abbe AmsterdamJessica Denesia Donelan, MD

## 2014-05-04 ENCOUNTER — Encounter: Payer: Self-pay | Admitting: Family Medicine

## 2014-05-04 LAB — COMPREHENSIVE METABOLIC PANEL
ALT: 16 U/L (ref 0–35)
AST: 23 U/L (ref 0–37)
Albumin: 4.2 g/dL (ref 3.5–5.2)
Alkaline Phosphatase: 73 U/L (ref 39–117)
BILIRUBIN TOTAL: 0.3 mg/dL (ref 0.2–1.2)
BUN: 13 mg/dL (ref 6–23)
CALCIUM: 9.3 mg/dL (ref 8.4–10.5)
CHLORIDE: 100 meq/L (ref 96–112)
CO2: 28 meq/L (ref 19–32)
Creat: 0.75 mg/dL (ref 0.50–1.10)
GLUCOSE: 84 mg/dL (ref 70–99)
Potassium: 3.8 mEq/L (ref 3.5–5.3)
SODIUM: 139 meq/L (ref 135–145)
TOTAL PROTEIN: 7.1 g/dL (ref 6.0–8.3)

## 2014-05-05 ENCOUNTER — Telehealth: Payer: Self-pay

## 2014-05-05 NOTE — Telephone Encounter (Signed)
Bonita QuinLinda from [facility] wanted to let Dr Patsy Lageropland know patient declined appointment with their office. Patient was scheduled with Dr Eden EmmsNishan for Jan 13th. When Half MoonLinda contacted patient to let her know she stated she has been taking her BP medicine and no longer needed to see them. Call back number for [facility] is [phone]

## 2014-05-06 NOTE — Telephone Encounter (Signed)
Called no answer

## 2014-05-09 NOTE — Telephone Encounter (Signed)
Called but the number we have on file is actually the number for [facility].  Will send pt a letter reminding her that she DOES need to see cardiology, please give them a call

## 2014-05-29 ENCOUNTER — Encounter: Payer: Self-pay | Admitting: *Deleted

## 2014-05-29 NOTE — Progress Notes (Signed)
Patient ID: Carnella GuadalajaraRuby Barabas, female   DOB: 04/03/1941, 74 y.o.   MRN: [id number]     74 y.o. history of HTN  Referred by Dr Dallas Schimkeopeland for chest pain  Atypical pains 12/15 and not compliant with BP meds  Restarted on Prinzide  Seen by Dr Jens Somrenshaw 2012  With normal stess echo   02/04/11  Stress echo reviewed   Baseline:  - LV global systolic function was normal. - Normal wall motion; no LV regional wall motion abnormalities. Peak stress:  - LV global systolic function was vigorous. - No evidence for new LV regional wall motion abnormalities.  ROS: Denies fever, malais, weight loss, blurry vision, decreased visual acuity, cough, sputum, SOB, hemoptysis, pleuritic pain, palpitaitons, heartburn, abdominal pain, melena, lower extremity edema, claudication, or rash.  All other systems reviewed and negative   General: Affect appropriate Healthy:  appears stated age HEENT: normal Neck supple with no adenopathy JVP normal no bruits no thyromegaly Lungs clear with no wheezing and good diaphragmatic motion Heart:  S1/S2 no murmur,rub, gallop or click PMI normal Abdomen: benighn, BS positve, no tenderness, no AAA no bruit.  No HSM or HJR Distal pulses intact with no bruits No edema Neuro non-focal Skin warm and dry No muscular weakness  Medications Current Outpatient Prescriptions  Medication Sig Dispense Refill  . lisinopril-hydrochlorothiazide (PRINZIDE,ZESTORETIC) 10-12.5 MG per tablet Take 1 tablet by mouth daily. 90 tablet 3  . meloxicam (MOBIC) 7.5 MG tablet Take 1 tablet (7.5 mg total) by mouth as needed for pain. (Patient not taking: Reported on 05/03/2014) 30 tablet 0  . [DISCONTINUED] carvedilol (COREG) 3.125 MG tablet Take 3.125 mg by mouth 2 (two) times daily with a meal.       No current facility-administered medications for this visit.    Allergies Review of patient's allergies indicates no known allergies.  Family History: Family History  Problem Relation Age of  Onset  . Heart attack Mother     Died at age 76 of MI  . Lung cancer Father   . Colon cancer Neg Hx   . Stomach cancer Neg Hx     Social History: History   Social History  . Marital Status: Divorced    Spouse Name: N/A    Number of Children: 3  . Years of Education: N/A   Occupational History  .      retired   Social History Main Topics  . Smoking status: Never Smoker   . Smokeless tobacco: Not on file  . Alcohol Use: No  . Drug Use: Not on file  . Sexual Activity: Not on file   Other Topics Concern  . Not on file   Social History Narrative    Past Surgical History  Procedure Laterality Date  . Cataract extraction      Past Medical History  Diagnosis Date  . HYPERTENSION   . GERD     Electrocardiogram:  12/15 SR LVH T wave inversion lead 3   Assessment and Plan

## 2014-05-30 ENCOUNTER — Encounter: Payer: Medicare HMO | Admitting: Cardiovascular Disease

## 2014-06-13 ENCOUNTER — Encounter: Payer: Self-pay | Admitting: Cardiovascular Disease

## 2014-08-30 DIAGNOSIS — Z13820 Encounter for screening for osteoporosis: Secondary | ICD-10-CM | POA: Diagnosis not present

## 2014-08-30 DIAGNOSIS — I10 Essential (primary) hypertension: Secondary | ICD-10-CM | POA: Diagnosis not present

## 2014-08-30 DIAGNOSIS — Z1239 Encounter for other screening for malignant neoplasm of breast: Secondary | ICD-10-CM | POA: Diagnosis not present

## 2014-08-30 DIAGNOSIS — Z0000 Encounter for general adult medical examination without abnormal findings: Secondary | ICD-10-CM | POA: Diagnosis not present

## 2014-09-21 ENCOUNTER — Other Ambulatory Visit: Payer: Self-pay | Admitting: Family Medicine

## 2014-09-21 DIAGNOSIS — Z13820 Encounter for screening for osteoporosis: Secondary | ICD-10-CM

## 2014-09-25 ENCOUNTER — Other Ambulatory Visit: Payer: Self-pay | Admitting: Family Medicine

## 2014-09-25 DIAGNOSIS — Z13820 Encounter for screening for osteoporosis: Secondary | ICD-10-CM

## 2014-09-25 DIAGNOSIS — Z1231 Encounter for screening mammogram for malignant neoplasm of breast: Secondary | ICD-10-CM

## 2014-12-14 ENCOUNTER — Telehealth: Payer: Self-pay

## 2014-12-14 NOTE — Telephone Encounter (Signed)
Pt daughter in [place] is needing to talk with dr copland about her mother and showing signs of serious dementia  Pt is there with her now in [place] in [place] and she is afraid to send her back home by her self.  And needs advice         Best number [phone]

## 2014-12-14 NOTE — Telephone Encounter (Signed)
Called her daughter back- they are worried about her being on her own back home.  Her mother has some dementia but she does not accept this idea.  Her daughter plans to have her seen by a neurologist while they are in [place]

## 2014-12-26 DIAGNOSIS — G5601 Carpal tunnel syndrome, right upper limb: Secondary | ICD-10-CM | POA: Diagnosis not present

## 2014-12-26 DIAGNOSIS — I10 Essential (primary) hypertension: Secondary | ICD-10-CM | POA: Diagnosis not present

## 2014-12-26 DIAGNOSIS — R413 Other amnesia: Secondary | ICD-10-CM | POA: Diagnosis not present

## 2014-12-29 DIAGNOSIS — G5601 Carpal tunnel syndrome, right upper limb: Secondary | ICD-10-CM | POA: Diagnosis not present

## 2014-12-29 DIAGNOSIS — I10 Essential (primary) hypertension: Secondary | ICD-10-CM | POA: Diagnosis not present

## 2014-12-29 DIAGNOSIS — R413 Other amnesia: Secondary | ICD-10-CM | POA: Diagnosis not present

## 2014-12-29 DIAGNOSIS — Z0000 Encounter for general adult medical examination without abnormal findings: Secondary | ICD-10-CM | POA: Diagnosis not present

## 2014-12-29 DIAGNOSIS — G5602 Carpal tunnel syndrome, left upper limb: Secondary | ICD-10-CM | POA: Diagnosis not present

## 2015-01-05 ENCOUNTER — Emergency Department (HOSPITAL_COMMUNITY)
Admission: EM | Admit: 2015-01-05 | Discharge: 2015-01-05 | Disposition: A | Discharge status: Home / Self Care | Payer: Medicare Other | Source: Home / Self Care | Attending: Emergency Medicine | Admitting: Emergency Medicine

## 2015-01-05 ENCOUNTER — Encounter (HOSPITAL_COMMUNITY): Payer: Self-pay

## 2015-01-05 DIAGNOSIS — G5602 Carpal tunnel syndrome, left upper limb: Secondary | ICD-10-CM

## 2015-01-05 DIAGNOSIS — R0789 Other chest pain: Secondary | ICD-10-CM | POA: Diagnosis not present

## 2015-01-05 DIAGNOSIS — M25462 Effusion, left knee: Secondary | ICD-10-CM

## 2015-01-05 DIAGNOSIS — R634 Abnormal weight loss: Secondary | ICD-10-CM

## 2015-01-05 MED ORDER — MELOXICAM 7.5 MG PO TABS: 7.5000 mg | ORAL_TABLET | Freq: Every day | ORAL | Status: DC

## 2015-01-05 NOTE — ED Notes (Signed)
C/o episodic pain in chest x 3 weeks. C/o pain and numbness episodic in left hand

## 2015-01-05 NOTE — ED Provider Notes (Signed)
CSN: [id number]     Arrival date & time 01/05/15  1507 History   First MD Initiated Contact with Patient 01/05/15 1528     Chief Complaint  Patient presents with  . Chest Pain   (Consider location/radiation/quality/duration/timing/severity/associated sxs/prior Treatment) HPI She is a 74 year old woman here for evaluation of left hand pain. She states over the last month she has had worsening numbness and tingling in her left hand. It is in her palm and middle fingers for the most part. She does report some mild weakness as well. She saw a doctor in [place], when she was visiting family, and he said it was something with her nerves. He gave her what sounds like a thumb brace. She states this has not helped.  She also states for the last month she has had intermittent left-sided chest pain. They typically come when she is laying on her right side and last for several minutes. No associated shortness of breath or dizziness. The pain does not radiate. She does report some diaphoresis. She also reports a 20 pound weight loss over the last several months. She states it is been quite a while since her last mammogram or colonoscopy.  She denies any lumps or bumps in her breasts. No blood in her stool.  Past Medical History  Diagnosis Date  . HYPERTENSION   . GERD    Past Surgical History  Procedure Laterality Date  . Cataract extraction     Family History  Problem Relation Age of Onset  . Heart attack Mother     Died at age 65 of MI  . Lung cancer Father   . Colon cancer Neg Hx   . Stomach cancer Neg Hx    Social History  Substance Use Topics  . Smoking status: Never Smoker   . Smokeless tobacco: None  . Alcohol Use: No   OB History    No data available     Review of Systems As in history of present illness Allergies  Review of patient's allergies indicates no known allergies.  Home Medications   Prior to Admission medications   Medication Sig Start Date End Date Taking?  Authorizing Provider  lisinopril-hydrochlorothiazide (PRINZIDE,ZESTORETIC) 10-12.5 MG per tablet Take 1 tablet by mouth daily. 05/03/14   Gwenlyn Found Copland, MD  meloxicam (MOBIC) 7.5 MG tablet Take 1 tablet (7.5 mg total) by mouth daily. For 2 weeks, then as needed for pain 01/05/15   Charm Rings, MD   BP 131/87 mmHg  Pulse 69  Temp(Src) 98 F (36.7 C) (Oral)  Resp 20  SpO2 100% Physical Exam  Constitutional: She is oriented to person, place, and time. She appears well-developed and well-nourished. No distress.  Cardiovascular: Normal rate, regular rhythm and normal heart sounds.   No murmur heard. Pulmonary/Chest: Breath sounds normal. No respiratory distress. She has no wheezes. She has no rales. She exhibits tenderness.    Musculoskeletal:  Left hand: No erythema or edema. She does have mild atrophy of the thenar eminence compared to the right. 5-/5 strength in grip and thumb opposition. Positive Tinel's on left.  Neurological: She is alert and oriented to person, place, and time.    ED Course  Procedures (including critical care time) ED ECG REPORT   Date: 01/05/2015  Rate: 59  Rhythm: sinus bradycardia  QRS Axis: normal  Intervals: normal  ST/T Wave abnormalities: isolated elevation in V2   Conduction Disutrbances:none  Narrative Interpretation: normal ekg  Old EKG Reviewed: unchanged  I  have personally reviewed the EKG tracing and agree with the computerized printout as noted.  Labs Review Labs Reviewed - No data to display  Imaging Review No results found.   MDM   1. Left carpal tunnel syndrome   2. Chest wall pain   3. Weight loss   4. Knee effusion, left    Volar wrist splint apply to left for carpal tunnel. Follow up with Dr. Mina Marble.  Meloxicam for chest wall pain and carpal tunnel. Strongly recommended establishing with PCP for work up of weight loss.    Charm Rings, MD 01/05/15 [phone]

## 2015-01-05 NOTE — Discharge Instructions (Signed)
You have carpal tunnel. Please wear the splint until you see Dr. Mina Marble.  If he will not see him for over one week, please take the splint off next Friday to do gentle range of motion. Your chest pain is coming from inflammation in the chest wall. Take meloxicam daily for the next 2 weeks, then as needed for pain. This will help with the chest pain and the hand. Follow-up with Dr. Mina Marble as soon as possible. Once you get your insurance card, please find a primary care doctor to evaluate the weight loss.

## 2015-01-17 ENCOUNTER — Telehealth: Payer: Self-pay

## 2015-01-17 NOTE — Telephone Encounter (Signed)
Ok- she has a known history of murmur

## 2015-01-17 NOTE — Telephone Encounter (Signed)
Pt was seen by wellness visit with [facility] [employer] [facility] and they heard a heart murmur -pt is not in distress but they have to notify the primary care of findings  Best number [phone]

## 2015-01-29 ENCOUNTER — Emergency Department (INDEPENDENT_AMBULATORY_CARE_PROVIDER_SITE_OTHER)
Admission: EM | Admit: 2015-01-29 | Discharge: 2015-01-29 | Disposition: A | Discharge status: Home / Self Care | Payer: Medicare Other | Source: Home / Self Care

## 2015-01-29 ENCOUNTER — Encounter (HOSPITAL_COMMUNITY): Payer: Self-pay | Admitting: Emergency Medicine

## 2015-01-29 DIAGNOSIS — M79642 Pain in left hand: Secondary | ICD-10-CM

## 2015-01-29 DIAGNOSIS — M940 Chondrocostal junction syndrome [Tietze]: Secondary | ICD-10-CM

## 2015-01-29 MED ORDER — PREDNISONE 10 MG (48) PO TBPK: ORAL_TABLET | Freq: Every day | ORAL | Status: DC

## 2015-01-29 NOTE — ED Notes (Signed)
Pt is back for continued pain in her left hand.  She has been taking her Meloxicam and wearing her splint as prescribed.  She states the pain keeps her up at night.

## 2015-01-29 NOTE — ED Provider Notes (Signed)
CSN: [id number]     Arrival date & time 01/29/15  1433 History   None    Chief Complaint  Patient presents with  . Hand Pain   (Consider location/radiation/quality/duration/timing/severity/associated sxs/prior Treatment) HPI  Left wrist pain. Ongoing for several weeks. Endorses that it involves the palm and all of the ventral surface of the fingers. Sometimes with weaker grip. Problem is fairly intermittent but is becoming more constant. Patient states that since her last appointment here at the urgent care on 01/05/2015 she is use the Ace wrap intermittently and is taking the meloxicam intermittently. Has not gotten any better and is in fact got worse. Patient did not follow-up with Dr. Mina Marble is instructed. As any joint swelling, other joint involvement, rash.   Chest wall discomfort. Left upper chest. Worse with deep breathing or palpation. Denies any palpitations, nausea, vomiting, LOC, radiation.       Past Medical History  Diagnosis Date  . HYPERTENSION    Past Surgical History  Procedure Laterality Date  . Cataract extraction     Family History  Problem Relation Age of Onset  . Heart attack Mother     Died at age 2 of MI  . Lung cancer Father   . Colon cancer Neg Hx   . Stomach cancer Neg Hx    Social History  Substance Use Topics  . Smoking status: Never Smoker   . Smokeless tobacco: None  . Alcohol Use: No   OB History    No data available     Review of Systems Per HPI with all other pertinent systems negative.   Allergies  Review of patient's allergies indicates no known allergies.  Home Medications   Prior to Admission medications   Medication Sig Start Date End Date Taking? Authorizing Provider  lisinopril-hydrochlorothiazide (PRINZIDE,ZESTORETIC) 10-12.5 MG per tablet Take 1 tablet by mouth daily. 05/03/14  Yes Gwenlyn Found Copland, MD  meloxicam (MOBIC) 7.5 MG tablet Take 1 tablet (7.5 mg total) by mouth daily. For 2 weeks, then as needed for  pain 01/05/15  Yes Charm Rings, MD  predniSONE (STERAPRED UNI-PAK 48 TAB) 10 MG (48) TBPK tablet Take by mouth daily. Take as instructed 01/29/15   Ozella Rocks, MD   Meds Ordered and Administered this Visit  Medications - No data to display  BP 167/75 mmHg  Pulse 61  Temp(Src) 97.4 F (36.3 C) (Oral)  Resp 16  SpO2 99% No data found.   Physical Exam Physical Exam  Constitutional: oriented to person, place, and time. appears well-developed and well-nourished. No distress.  HENT:  Head: Normocephalic and atraumatic.  Eyes: EOMI. PERRL.  Neck: Normal range of motion.  Cardiovascular: RRR, no m/r/g, 2+ distal pulses,  Pulmonary/Chest: Effort normal and breath sounds normal. No respiratory distress.  Abdominal: Soft. Bowel sounds are normal. NonTTP, no distension.  Musculoskeletal: Mild chest wall tenderness along the left sternal border, left wrist normal.  Neurological: alert and oriented to person, place, and time.  Skin: Skin is warm. No rash noted. non diaphoretic.  Psychiatric: normal mood and affect. behavior is normal. Judgment and thought content normal.   ED Course  Procedures (including critical care time)  Labs Review Labs Reviewed - No data to display  Imaging Review No results found.   Visual Acuity Review  Right Eye Distance:   Left Eye Distance:   Bilateral Distance:    Right Eye Near:   Left Eye Near:    Bilateral Near:  MDM   1. Hand pain, left   2. Costochondritis     Patient did not follow-up with Dr.Weingold as previously instructed. Recommending she do so at this time. Patient start on a 12 day steroid Dosepak as well as start using a static wrist splint as opposed to a simple wrist Ace wrap. Patient also change her sleeping habits as this will likely help to improve the costochondritis.   Ozella Rocks, MD 01/29/15 [phone]

## 2015-01-29 NOTE — Discharge Instructions (Signed)
You have developed persistent left hand and wrist pain which is possibly due to carpal tunnel. Please follow-up with Dr. Mina Marble as previously instructed. Please use the prednisone as prescribed. You can restart the meloxicam after finishing the steroid. Please consider sleeping on your back or stomach in order to relieve any chest pressure which is likely causing the costochondritis.

## 2015-03-26 ENCOUNTER — Encounter (HOSPITAL_COMMUNITY): Payer: Self-pay | Admitting: Emergency Medicine

## 2015-03-26 ENCOUNTER — Emergency Department (INDEPENDENT_AMBULATORY_CARE_PROVIDER_SITE_OTHER)
Admission: EM | Admit: 2015-03-26 | Discharge: 2015-03-26 | Disposition: A | Discharge status: Home / Self Care | Payer: Medicare Other | Source: Home / Self Care

## 2015-03-26 DIAGNOSIS — H578 Other specified disorders of eye and adnexa: Secondary | ICD-10-CM

## 2015-03-26 DIAGNOSIS — H5789 Other specified disorders of eye and adnexa: Secondary | ICD-10-CM

## 2015-03-26 DIAGNOSIS — L659 Nonscarring hair loss, unspecified: Secondary | ICD-10-CM

## 2015-03-26 NOTE — ED Notes (Signed)
Patient has multiple complaints.  Patient reports she is noticing hair thinning for 3 weeks.  Patient concerned for bilateral eye redness for the same time frame.  Patient also concerned for weight loss.  Reports 10-12 pounds in 2 month time frame.  Patient denies having a pcp.

## 2015-03-26 NOTE — Discharge Instructions (Signed)
Make an appointment with your medical doctor to discuss your hair loss.  It is also advised that you not use visine daily as too much visine can cause redness of the eyes.   You soulc also follow up with an eye doctor if your symptoms persist.

## 2015-04-02 DIAGNOSIS — M810 Age-related osteoporosis without current pathological fracture: Secondary | ICD-10-CM | POA: Diagnosis not present

## 2015-04-02 DIAGNOSIS — R413 Other amnesia: Secondary | ICD-10-CM | POA: Diagnosis not present

## 2015-04-02 DIAGNOSIS — R202 Paresthesia of skin: Secondary | ICD-10-CM | POA: Diagnosis not present

## 2015-04-02 DIAGNOSIS — I10 Essential (primary) hypertension: Secondary | ICD-10-CM | POA: Diagnosis not present

## 2015-06-19 ENCOUNTER — Emergency Department (INDEPENDENT_AMBULATORY_CARE_PROVIDER_SITE_OTHER)
Admission: EM | Admit: 2015-06-19 | Discharge: 2015-06-19 | Disposition: A | Discharge status: Home / Self Care | Payer: Medicare HMO | Source: Home / Self Care | Attending: Family Medicine | Admitting: Family Medicine

## 2015-06-19 ENCOUNTER — Encounter (HOSPITAL_COMMUNITY): Payer: Self-pay | Admitting: Emergency Medicine

## 2015-06-19 DIAGNOSIS — Z9119 Patient's noncompliance with other medical treatment and regimen: Secondary | ICD-10-CM | POA: Diagnosis not present

## 2015-06-19 DIAGNOSIS — G5602 Carpal tunnel syndrome, left upper limb: Secondary | ICD-10-CM | POA: Diagnosis not present

## 2015-06-19 DIAGNOSIS — Z91199 Patient's noncompliance with other medical treatment and regimen due to unspecified reason: Secondary | ICD-10-CM

## 2015-06-19 MED ORDER — PREDNISONE 20 MG PO TABS: ORAL_TABLET | ORAL | Status: DC

## 2015-06-19 NOTE — ED Provider Notes (Signed)
CSN: [id number]     Arrival date & time 06/19/15  1427 History   First MD Initiated Contact with Patient 06/19/15 1554     No chief complaint on file.  (Consider location/radiation/quality/duration/timing/severity/associated sxs/prior Treatment) HPI Comments: 75 year old female presents with left wrist and hand pain with numbness. She has been diagnosed with carpal tunnel syndrome in the past. This is her third visit to the urgent care and she has also had a past visit with her PCP. She had been instructed to follow-up with the orthopedist on-call and she states that she did not repeat her instructions noted she make appointment with the orthopedist. She states it got better for a while but then the pain came back. She is not following any of the instructions that have been given to her over the past several visits for the same problem. The pain and numbness is located to the left hand and wrist. It is getting worse.   Past Medical History  Diagnosis Date  . HYPERTENSION    Past Surgical History  Procedure Laterality Date  . Cataract extraction     Family History  Problem Relation Age of Onset  . Heart attack Mother     Died at age 68 of MI  . Lung cancer Father   . Colon cancer Neg Hx   . Stomach cancer Neg Hx    Social History  Substance Use Topics  . Smoking status: Never Smoker   . Smokeless tobacco: Not on file  . Alcohol Use: No   OB History    No data available     Review of Systems  Constitutional: Positive for activity change. Negative for fever.  Musculoskeletal: Negative for neck pain.       As per history of present illness  Skin: Negative.   Neurological: Positive for numbness.  All other systems reviewed and are negative.   Allergies  Review of patient's allergies indicates no known allergies.  Home Medications   Prior to Admission medications   Medication Sig Start Date End Date Taking? Authorizing Provider  lisinopril-hydrochlorothiazide  (PRINZIDE,ZESTORETIC) 10-12.5 MG per tablet Take 1 tablet by mouth daily. 05/03/14   Gwenlyn Found Copland, MD  predniSONE (DELTASONE) 20 MG tablet 3 Tabs PO Days 1-3, then 2 tabs PO Days 4-6, then 1 tab PO Day 7-9, then Half Tab PO Day 10-12. Take with food. 06/19/15   Hayden Rasmussen, NP  Scar [employer] [facility] EX) Apply topically.    Historical Provider, MD   Meds Ordered and Administered this Visit  Medications - No data to display  BP 168/80 mmHg  Pulse 67  Temp(Src) 98 F (36.7 C) (Oral)  Resp 16  SpO2 99% No data found.   Physical Exam  Constitutional: She appears well-developed and well-nourished. No distress.  Neck: Normal range of motion. Neck supple.  Pulmonary/Chest: Effort normal.  Musculoskeletal:  Tenderness to the wrist and hand. Flexion and sent and the wrist is painful. Circulation is intact. Normal warmth and color.  Neurological: She is alert. No cranial nerve deficit. She exhibits normal muscle tone.  Skin: Skin is warm.  Psychiatric: She has a normal mood and affect.  Nursing note and vitals reviewed.   ED Course  Procedures (including critical care time)  Labs Review Labs Reviewed - No data to display  Imaging Review No results found.   Visual Acuity Review  Right Eye Distance:   Left Eye Distance:   Bilateral Distance:    Right Eye Near:  Left Eye Near:    Bilateral Near:         MDM   1. Carpal tunnel syndrome of left wrist   2. Personal history of noncompliance with medical treatment, presenting hazards to health    Wear your wrist immobilizer. Use ice Take the prednisone. You must contact the orthopedist on p. 1. Do not delay.  Follow instructions and read them.     Hayden Rasmussen, NP 06/19/15 [phone]

## 2015-06-19 NOTE — Discharge Instructions (Signed)
Carpal Tunnel Syndrome Wear your wrist immobilizer. Use ice Take the prednisone. You must contact the orthopedist on p. 1. Do not delay.  Carpal tunnel syndrome is a condition that causes pain in your hand and arm. The carpal tunnel is a narrow area located on the palm side of your wrist. Repeated wrist motion or certain diseases may cause swelling within the tunnel. This swelling pinches the main nerve in the wrist (median nerve). CAUSES  This condition may be caused by:   Repeated wrist motions.  Wrist injuries.  Arthritis.  A cyst or tumor in the carpal tunnel.  Fluid buildup during pregnancy. Sometimes the cause of this condition is not known.  RISK FACTORS This condition is more likely to develop in:   People who have jobs that cause them to repeatedly move their wrists in the same motion, such as butchers and cashiers.  Women.  People with certain conditions, such as:  Diabetes.  Obesity.  An underactive thyroid (hypothyroidism).  Kidney failure. SYMPTOMS  Symptoms of this condition include:   A tingling feeling in your fingers, especially in your thumb, index, and middle fingers.  Tingling or numbness in your hand.  An aching feeling in your entire arm, especially when your wrist and elbow are bent for long periods of time.  Wrist pain that goes up your arm to your shoulder.  Pain that goes down into your palm or fingers.  A weak feeling in your hands. You may have trouble grabbing and holding items. Your symptoms may feel worse during the night.  DIAGNOSIS  This condition is diagnosed with a medical history and physical exam. You may also have tests, including:   An electromyogram (EMG). This test measures electrical signals sent by your nerves into the muscles.  X-rays. TREATMENT  Treatment for this condition includes:  Lifestyle changes. It is important to stop doing or modify the activity that caused your condition.  Physical or occupational  therapy.  Medicines for pain and inflammation. This may include medicine that is injected into your wrist.  A wrist splint.  Surgery. HOME CARE INSTRUCTIONS  If You Have a Splint:  Wear it as told by your health care provider. Remove it only as told by your health care provider.  Loosen the splint if your fingers become numb and tingle, or if they turn cold and blue.  Keep the splint clean and dry. General Instructions  Take over-the-counter and prescription medicines only as told by your health care provider.  Rest your wrist from any activity that may be causing your pain. If your condition is work related, talk to your employer about changes that can be made, such as getting a wrist pad to use while typing.  If directed, apply ice to the painful area:  Put ice in a plastic bag.  Place a towel between your skin and the bag.  Leave the ice on for 20 minutes, 2-3 times per day.  Keep all follow-up visits as told by your health care provider. This is important.  Do any exercises as told by your health care provider, physical therapist, or occupational therapist. SEEK MEDICAL CARE IF:   You have new symptoms.  Your pain is not controlled with medicines.  Your symptoms get worse.   This information is not intended to replace advice given to you by your health care provider. Make sure you discuss any questions you have with your health care provider.   Document Released: 05/02/2000 Document Revised: 01/24/2015 Document Reviewed:  09/20/2014 Elsevier Interactive Patient Education 2016 Else[employer] Inc.  Carpal Tunnel Release Carpal tunnel release is a surgical procedure to relieve numbness and pain in your hand that are caused by carpal tunnel syndrome. Your carpal tunnel is a narrow, hollow space in your wrist. It passes between your wrist bones and a band of connective tissue (transverse carpal ligament). The nerve that supplies most of your hand (median nerve) passes through  this space, and so do the connections between your fingers and the muscles of your arm (tendons). Carpal tunnel syndrome makes this space swell and become narrow, and this causes pain and numbness. In carpal tunnel release surgery, a surgeon cuts through the transverse carpal ligament to make more room in the carpal tunnel space. You may have this surgery if other types of treatment have not worked. LET [facility] CARE PROVIDER KNOW ABOUT:  Any allergies you have.  All medicines you are taking, including vitamins, herbs, eye drops, creams, and over-the-counter medicines.  Previous problems you or members of your family have had with the use of anesthetics.  Any blood disorders you have.  Previous surgeries you have had.  Medical conditions you have. RISKS AND COMPLICATIONS Generally, this is a safe procedure. However, problems may occur, including:  Bleeding.  Infection.  Injury to the median nerve.  Need for additional surgery. BEFORE THE PROCEDURE  Ask your health care provider about:  Changing or stopping your regular medicines. This is especially important if you are taking diabetes medicines or blood thinners.  Taking medicines such as aspirin and ibuprofen. These medicines can thin your blood. Do not take these medicines before your procedure if your health care provider instructs you not to.  Do not eat or drink anything after midnight on the night before the procedure or as directed by your health care provider.  Plan to have someone take you home after the procedure. PROCEDURE  An IV tube may be inserted into a vein.  You will be given one of the following:  A medicine that numbs the wrist area (local anesthetic). You may also be given a medicine to make you relax (sedative).  A medicine that makes you go to sleep (general anesthetic).  Your arm, hand, and wrist will be cleaned with a germ-killing solution (antiseptic).  Your surgeon will make a surgical cut  (incision) over the palm side of your wrist. The surgeon will pull aside the skin of your wrist to expose the carpal tunnel space.  The surgeon will cut the transverse carpal ligament.  The edges of the incision will be closed with stitches (sutures) or staples.  A bandage (dressing) will be placed over your wrist and wrapped around your hand and wrist. AFTER THE PROCEDURE  You may spend some time in a recovery area.  Your blood pressure, heart rate, breathing rate, and blood oxygen level will be monitored often until the medicines you were given have worn off.  You will likely have some pain. You will be given pain medicine.  You may need to wear a splint or a wrist brace over your dressing.   This information is not intended to replace advice given to you by your health care provider. Make sure you discuss any questions you have with your health care provider.   Document Released: 07/26/2003 Document Revised: 05/26/2014 Document Reviewed: 12/21/2013 Elsevier Interactive Patient Education [employer]

## 2015-07-16 ENCOUNTER — Other Ambulatory Visit: Payer: Self-pay | Admitting: Family Medicine

## 2015-08-08 ENCOUNTER — Emergency Department (INDEPENDENT_AMBULATORY_CARE_PROVIDER_SITE_OTHER)
Admission: EM | Admit: 2015-08-08 | Discharge: 2015-08-08 | Disposition: A | Discharge status: Home / Self Care | Payer: Medicare HMO | Source: Home / Self Care | Attending: Emergency Medicine | Admitting: Emergency Medicine

## 2015-08-08 ENCOUNTER — Encounter (HOSPITAL_COMMUNITY): Payer: Self-pay | Admitting: Emergency Medicine

## 2015-08-08 DIAGNOSIS — R0789 Other chest pain: Secondary | ICD-10-CM | POA: Diagnosis not present

## 2015-08-08 MED ORDER — NITROGLYCERIN 0.4 MG SL SUBL: 0.4000 mg | SUBLINGUAL_TABLET | SUBLINGUAL | Status: DC | PRN

## 2015-08-08 NOTE — ED Notes (Signed)
Patient has chest pain, left chest described as dull.  Pain incident occurred last night around 10 pm.  Lasted one hour.  Patient remained quiet and prayed and pain resolved after an hour.  No nausea, no vomiting, no sob, no diaphoresis.  Patient reports a second episode today around 3:00pm today, again lasting an hour and resolved.

## 2015-08-08 NOTE — Discharge Instructions (Signed)
You should get a call from the cardiologist's office tomorrow to set up an appointment. If you have not heard from them by Friday, please give them a call. Please take a baby aspirin a day until you see the cardiologist. I sent a prescription for nitroglycerin to your pharmacy. Please pick this up today. If you develop repeat chest pain, take one of the nitroglycerin to see if it relieves the pain. If the pain is persistent, associated with nausea, shortness of breath, or dizziness, please call 911.

## 2015-08-08 NOTE — ED Provider Notes (Signed)
CSN: [id number]     Arrival date & time 08/08/15  1652 History   First MD Initiated Contact with Patient 08/08/15 1711     Chief Complaint  Patient presents with  . Chest Pain   (Consider location/radiation/quality/duration/timing/severity/associated sxs/prior Treatment) HPI  She is a 75 year old woman here for evaluation of chest pain. She states that last night she had an episode of left-sided chest pain that lasted for 1 hour. It was not associated with exertion. No associated nausea, dizziness, palpitations, or shortness of breath. She states it resolved on its own after about an hour. The pain is described as between sharp and dull. It did not radiate. Today, she had a similar episode at 3 PM. It again lasted an hour and resolved spontaneously. She does have a history of hypertension for which she takes lisinopril. She denies any personal or family history of cardiac problems. She is a never smoker. She denies any current chest pain.  Past Medical History  Diagnosis Date  . HYPERTENSION    Past Surgical History  Procedure Laterality Date  . Cataract extraction     Family History  Problem Relation Age of Onset  . Heart attack Mother     Died at age 77 of MI  . Lung cancer Father   . Colon cancer Neg Hx   . Stomach cancer Neg Hx    Social History  Substance Use Topics  . Smoking status: Never Smoker   . Smokeless tobacco: None  . Alcohol Use: No   OB History    No data available     Review of Systems As in history of present illness Allergies  Review of patient's allergies indicates no known allergies.  Home Medications   Prior to Admission medications   Medication Sig Start Date End Date Taking? Authorizing Provider  lisinopril-hydrochlorothiazide (PRINZIDE,ZESTORETIC) 10-12.5 MG tablet TAKE ONE TABLET BY MOUTH ONCE DAILY 07/18/15  Yes Chelle Jeffery, PA-C  nitroGLYCERIN (NITROSTAT) 0.4 MG SL tablet Place 1 tablet (0.4 mg total) under the tongue every 5 (five) minutes  as needed for chest pain. 08/08/15   Charm RingsErin J Daffney Greenly, MD  predniSONE (DELTASONE) 20 MG tablet 3 Tabs PO Days 1-3, then 2 tabs PO Days 4-6, then 1 tab PO Day 7-9, then Half Tab PO Day 10-12. Take with food. 06/19/15   Hayden Rasmussenavid Mabe, NP  Scar Treatment Products [facility] EX) Apply topically.    Historical Provider, MD   Meds Ordered and Administered this Visit  Medications - No data to display  BP 152/85 mmHg  Pulse 79  Temp(Src) 98.4 F (36.9 C) (Oral)  SpO2 100% No data found.   Physical Exam  Constitutional: She is oriented to person, place, and time. She appears well-developed and well-nourished. No distress.  Cardiovascular: Normal rate, regular rhythm and normal heart sounds.   No murmur heard. Pulmonary/Chest: Effort normal and breath sounds normal. No respiratory distress. She has no wheezes. She has no rales. She exhibits no tenderness.  Neurological: She is alert and oriented to person, place, and time.    ED Course  Procedures (including critical care time) ED ECG REPORT   Date: 08/08/2015  Rate: 63  Rhythm: normal sinus rhythm  QRS Axis: normal  Intervals: normal  ST/T Wave abnormalities: normal  Conduction Disutrbances:none  Narrative Interpretation: NSR, normal EKG  Old EKG Reviewed: unchanged  I have personally reviewed the EKG tracing and agree with the computerized printout as noted.  Labs Review Labs Reviewed - No data to display  Imaging Review No results found.   MDM   1. Atypical chest pain    Chest pain is atypical and she is relatively low risk. On chart review, she did see Dr. Jens Som in 2012 and have a stress echo done at that time that was nondiagnostic. I spoke with Dr. Excell Seltzer, cardiologist on-call. He will get her set up for an outpatient appointment this week for additional evaluation. I've instructed her to take a baby aspirin a day. Prescription given for nitroglycerin to use as needed for chest pain. Strict return precautions  reviewed.    Charm Rings, MD 08/08/15 [phone]

## 2015-12-27 ENCOUNTER — Ambulatory Visit (INDEPENDENT_AMBULATORY_CARE_PROVIDER_SITE_OTHER): Payer: Medicare HMO

## 2015-12-27 ENCOUNTER — Ambulatory Visit (INDEPENDENT_AMBULATORY_CARE_PROVIDER_SITE_OTHER): Payer: Medicare HMO | Admitting: Physician Assistant

## 2015-12-27 VITALS — BP 114/76 | HR 62 | Temp 98.0°F | Resp 16 | Ht 62.72 in | Wt 114.0 lb

## 2015-12-27 DIAGNOSIS — M5410 Radiculopathy, site unspecified: Secondary | ICD-10-CM

## 2015-12-27 MED ORDER — PREDNISONE 20 MG PO TABS: 40.0000 mg | ORAL_TABLET | Freq: Every day | ORAL | 0 refills | Status: DC

## 2015-12-27 NOTE — Progress Notes (Signed)
12/27/2015 2:40 PM   DOB: 04-21-1941 / MRN: [id number]  SUBJECTIVE:  Gloria Navarro is a 75 y.o. female presenting for bilateral hand pain (left>right)  thought to be 2/2 carpal tunnel. Reports he pain has been worse over the last two weeks.  She describes the pain and a burning and numbing pain. States the pain affect all five fingers.  She feels she is getting worse.  Has taken baby ASA for the pain.  Denies a history of diabetes.    Depression screen PHQ 2/9 12/27/2015  Decreased Interest 0  Down, Depressed, Hopeless 0  PHQ - 2 Score 0     She has No Known Allergies.   She  has a past medical history of HYPERTENSION.    She  reports that she has never smoked. She does not have any smokeless tobacco history on file. She reports that she does not drink alcohol or use drugs. She  has no sexual activity history on file. The patient  has a past surgical history that includes Cataract extraction.  Her family history includes Heart attack in her mother; Lung cancer in her father.  Review of Systems  Constitutional: Negative for fever.  Gastrointestinal: Negative for nausea.  Musculoskeletal: Positive for joint pain. Negative for neck pain.  Skin: Negative for rash.  Neurological: Negative for dizziness, focal weakness and headaches.  Psychiatric/Behavioral: Negative for depression.    The problem list and medications were reviewed and updated by myself where necessary and exist elsewhere in the encounter.   OBJECTIVE:  BP 114/76 (BP Location: Right Arm, Patient Position: Sitting, Cuff Size: Normal)   Pulse 62   Temp 98 F (36.7 C) (Oral)   Resp 16   Ht 5' 2.72" (1.593 m)   Wt 114 lb (51.7 kg)   SpO2 98%   BMI 20.37 kg/m   Physical Exam  Constitutional: She is oriented to person, place, and time. She appears well-nourished. No distress.  Eyes: EOM are normal. Pupils are equal, round, and reactive to light.  Cardiovascular: Normal rate, regular rhythm and normal heart  sounds.   Pulmonary/Chest: Effort normal and breath sounds normal.  Abdominal: She exhibits no distension.  Musculoskeletal: Normal range of motion.       Arms: Neurological: She is alert and oriented to person, place, and time. No cranial nerve deficit. Gait normal.  Skin: Skin is warm and dry. She is not diaphoretic.  Psychiatric: She has a normal mood and affect.  Vitals reviewed.  No results found for: HGBA1C  Lab Results  Component Value Date   CREATININE 0.75 05/03/2014   CREATININE 0.74 09/06/2008   CREATININE 0.76 08/23/2007    No results found for this or any previous visit (from the past 72 hour(s)).  Dg Cervical Spine Complete  Result Date: 12/27/2015 CLINICAL DATA:  BILATERAL hand pain LEFT greater than RIGHT worsened over last 2 weeks, pain with burning and numbing character affecting all 5 fingers, radiculopathy affecting upper extremities EXAM: CERVICAL SPINE - COMPLETE 4+ VIEW COMPARISON:  None FINDINGS: Prevertebral soft tissues normal thickness. Disc space narrowing with endplate spur formation at C3-C4 through C6-C7. Vertebral body heights maintained without fracture or subluxation. Small uncovertebral spurs at cervical foramina bilaterally. C1-C2 alignment normal for slight head rotation. Lung apices clear. Minimal atherosclerotic calcification in the RIGHT carotid system. IMPRESSION: Degenerative disc disease changes cervical spine. No acute abnormalities. Electronically Signed   By: Ulyses Southward M.D.   On: 12/27/2015 14:35    ASSESSMENT AND PLAN  Janat was seen today for arm pain.  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 CHL shows not evidence of diabetes.  Will start a low dose of prednisone and advised tylenol per AVS. Gabapentin is a consideration for treatment.  -     DG Cervical Spine Complete; Future -     Prednisone 40 mg daily for 5 days.     The patient is  advised to call or return to clinic if she does not see an improvement in symptoms, or to seek the care of the closest emergency department if she worsens with the above plan.   Deliah BostonMichael Ola Fawver, MHS, PA-C [facility] 12/27/2015 2:40 PM

## 2015-12-27 NOTE — Patient Instructions (Addendum)
Take [phone] mg of tylenol every eight hours for pain. Please complete prednisone as prescribed.  Call if your pain if not improved and I will refer you to orthopedics.      IF you received an x-ray today, you will receive an invoice from [facility]. Please contact [facility] at [phone] with questions or concerns regarding your invoice.   IF you received labwork today, you will receive an invoice from [facility]. Please contact [facility] at [phone] with questions or concerns regarding your invoice.   Our billing staff will not be able to assist you with questions regarding bills from these companies.  You will be contacted with the lab results as soon as they are available. The fastest way to get your results is to activate your My Chart account. Instructions are located on the last page of this paperwork. If you have not heard from us regarding the results in 2 weeks, please contact this office.

## 2016-01-07 ENCOUNTER — Telehealth: Payer: Self-pay

## 2016-01-07 ENCOUNTER — Other Ambulatory Visit: Payer: Self-pay | Admitting: Physician Assistant

## 2016-01-07 NOTE — Telephone Encounter (Signed)
PATIENT STATES SHE SAW MICHAEL CLARK ABOUT 2 WEEKS AGO FOR CARPEL TUNNEL. HE PRESCRIBED HER PREDNISONE 20 MG. IT HAS HELPED HER SO SHE WOULD LIKE TO GET IT REFILLED. WHEN SHE CALLED HER PHARMACY THEY TOLD HER THEY DID NOT HAVE A RECORD OF HER EVER GETTING IT. SHE SAID SHE GOT IT FILLED THE FIRST TIME SHE GOT IT AT HER SAME PHARMACY. SHE DOES NOT UNDERSTAND WHEY THEY CAN NOT FIND IT. THE ARTHRITIS IN HER NECK AND HANDS IS REALLY HURTING HER. SHE STILL CAN NOT SLEEP. SHE WOULD LIKE US TO CALL AND FIND OUT WHAT IS GOING ON FOR HER. BEST PHONE [phone] (CELL)  PHARMACY CHOICE IS [facility] ON [address]  MBC

## 2016-01-08 NOTE — Telephone Encounter (Signed)
I think she will fair best with starting gabapentin.  Please call in gabapentin 300 mg #90.  Sig: Take one on day one, then one bid on day two, then take three TID and continue taking three TID. Lets hold on tramadol for now as she may get relief with this.  We can add tramadol later and sparingly only if her pain remains intolerable.  No more prednisone for now as doing this to close in succession could cause her harm.  Encourage the use of tylenol at 1000 mg q8 along with gabapentin. I hope she feels better with this plan. Deliah BostonMichael Yula Crotwell, MS, PA-C 1:59 PM, 01/08/2016

## 2016-01-08 NOTE — Telephone Encounter (Signed)
Casimiro NeedleMichael, I know that you will not want to RF the pred, but saw the following in your OV notes' Plan:  Will start a low dose of prednisone and advised tylenol per AVS. Gabapentin is a consideration for treatment.   Do you want to Rx gabapentin for pt to try? Or refer to ortho. Your plan was to send to ortho is she did not improve, but since she did improve with the pred, wasn't sure which you would want to do?

## 2016-01-09 MED ORDER — GABAPENTIN 300 MG PO CAPS: ORAL_CAPSULE | ORAL | 0 refills | Status: DC

## 2016-01-09 NOTE — Telephone Encounter (Signed)
Sent in new Rx per order below. LMOM for pt advising that Casimiro NeedleMichael could not RF pred because not safe to repeat so soon. Instead sent in Rx for other medication. Asked for pt to CB to get very specific instr's as to how to take the new med and what she can take with it.

## 2016-01-24 ENCOUNTER — Ambulatory Visit (INDEPENDENT_AMBULATORY_CARE_PROVIDER_SITE_OTHER): Payer: Medicare HMO | Admitting: Urgent Care

## 2016-01-24 VITALS — BP 140/84 | HR 66 | Temp 98.2°F | Resp 18

## 2016-01-24 DIAGNOSIS — R079 Chest pain, unspecified: Secondary | ICD-10-CM

## 2016-01-24 NOTE — Patient Instructions (Addendum)
Nonspecific Chest Pain  Chest pain can be caused by many different conditions. There is always a chance that your pain could be related to something serious, such as a heart attack or a blood clot in your lungs. Chest pain can also be caused by conditions that are not life-threatening. If you have chest pain, it is very important to follow up with your health care provider. CAUSES  Chest pain can be caused by:  Heartburn.  Pneumonia or bronchitis.  Anxiety or stress.  Inflammation around your heart (pericarditis) or lung (pleuritis or pleurisy).  A blood clot in your lung.  A collapsed lung (pneumothorax). It can develop suddenly on its own (spontaneous pneumothorax) or from trauma to the chest.  Shingles infection (varicella-zoster virus).  Heart attack.  Damage to the bones, muscles, and cartilage that make up your chest wall. This can include:  Bruised bones due to injury.  Strained muscles or cartilage due to frequent or repeated coughing or overwork.  Fracture to one or more ribs.  Sore cartilage due to inflammation (costochondritis). RISK FACTORS  Risk factors for chest pain may include:  Activities that increase your risk for trauma or injury to your chest.  Respiratory infections or conditions that cause frequent coughing.  Medical conditions or overeating that can cause heartburn.  Heart disease or family history of heart disease.  Conditions or health behaviors that increase your risk of developing a blood clot.  Having had chicken pox (varicella zoster). SIGNS AND SYMPTOMS Chest pain can feel like:  Burning or tingling on the surface of your chest or deep in your chest.  Crushing, pressure, aching, or squeezing pain.  Dull or sharp pain that is worse when you move, cough, or take a deep breath.  Pain that is also felt in your back, neck, shoulder, or arm, or pain that spreads to any of these areas. Your chest pain may come and go, or it may stay  constant. DIAGNOSIS Lab tests or other studies may be needed to find the cause of your pain. Your health care provider may have you take a test called an ambulatory ECG (electrocardiogram). An ECG records your heartbeat patterns at the time the test is performed. You may also have other tests, such as:  Transthoracic echocardiogram (TTE). During echocardiography, sound waves are used to create a picture of all of the heart structures and to look at how blood flows through your heart.  Transesophageal echocardiogram (TEE).This is a more advanced imaging test that obtains images from inside your body. It allows your health care provider to see your heart in finer detail.  Cardiac monitoring. This allows your health care provider to monitor your heart rate and rhythm in real time.  Holter monitor. This is a portable device that records your heartbeat and can help to diagnose abnormal heartbeats. It allows your health care provider to track your heart activity for several days, if needed.  Stress tests. These can be done through exercise or by taking medicine that makes your heart beat more quickly.  Blood tests.  Imaging tests. TREATMENT  Your treatment depends on what is causing your chest pain. Treatment may include:  Medicines. These may include:  Acid blockers for heartburn.  Anti-inflammatory medicine.  Pain medicine for inflammatory conditions.  Antibiotic medicine, if an infection is present.  Medicines to dissolve blood clots.  Medicines to treat coronary artery disease.  Supportive care for conditions that do not require medicines. This may include:  Resting.  Applying heat   or cold packs to injured areas.  Limiting activities until pain decreases. HOME CARE INSTRUCTIONS  If you were prescribed an antibiotic medicine, finish it all even if you start to feel better.  Avoid any activities that bring on chest pain.  Do not use any tobacco products, including  cigarettes, chewing tobacco, or electronic cigarettes. If you need help quitting, ask your health care provider.  Do not drink alcohol.  Take medicines only as directed by your health care provider.  Keep all follow-up visits as directed by your health care provider. This is important. This includes any further testing if your chest pain does not go away.  If heartburn is the cause for your chest pain, you may be told to keep your head raised (elevated) while sleeping. This reduces the chance that acid will go from your stomach into your esophagus.  Make lifestyle changes as directed by your health care provider. These may include:  Getting regular exercise. Ask your health care provider to suggest some activities that are safe for you.  Eating a heart-healthy diet. A registered dietitian can help you to learn healthy eating options.  Maintaining a healthy weight.  Managing diabetes, if necessary.  Reducing stress. SEEK MEDICAL CARE IF:  Your chest pain does not go away after treatment.  You have a rash with blisters on your chest.  You have a fever. SEEK IMMEDIATE MEDICAL CARE IF:   Your chest pain is worse.  You have an increasing cough, or you cough up blood.  You have severe abdominal pain.  You have severe weakness.  You faint.  You have chills.  You have sudden, unexplained chest discomfort.  You have sudden, unexplained discomfort in your arms, back, neck, or jaw.  You have shortness of breath at any time.  You suddenly start to sweat, or your skin gets clammy.  You feel nauseous or you vomit.  You suddenly feel light-headed or dizzy.  Your heart begins to beat quickly, or it feels like it is skipping beats. These symptoms may represent a serious problem that is an emergency. Do not wait to see if the symptoms will go away. Get medical help right away. Call your local emergency services (911 in the [place] Do not drive yourself to the hospital.   This  information is not intended to replace advice given to you by your health care provider. Make sure you discuss any questions you have with your health care provider.   Document Released: 02/12/2005 Document Revised: 05/26/2014 Document Reviewed: 12/09/2013 Elsevier Interactive Patient Education 2016 Elsevier Inc.     IF you received an x-ray today, you will receive an invoice from [facility]. Please contact [facility] at [phone] with questions or concerns regarding your invoice.   IF you received labwork today, you will receive an invoice from [facility]. Please contact [facility] at [phone] with questions or concerns regarding your invoice.   Our billing staff will not be able to assist you with questions regarding bills from these companies.  You will be contacted with the lab results as soon as they are available. The fastest way to get your results is to activate your My Chart account. Instructions are located on the last page of this paperwork. If you have not heard from us regarding the results in 2 weeks, please contact this office.      

## 2016-01-24 NOTE — Progress Notes (Signed)
    MRN: [id number] DOB: 09/24/1940  Subjective:   Gloria Navarro is a 75 y.o. female presenting for chief complaint of Chest Pain (x 2 weeks off and on)  Reports 1 week history of intermittent left-sided chest pain. Patient states that it has been associated with stress. Today, she reports that it has been present since noon. Rates it as a 1/10, non-radiating, associated with numbness of her left forearm and hand. She denies cough, shob, n/v, abdominal pain, diaphoresis. Denies smoking cigarettes. Denies history of MI.  Gloria Navarro has a current medication list which includes the following prescription(s): lisinopril-hydrochlorothiazide. Also has No Known Allergies.  Gloria Navarro  has a past medical history of HYPERTENSION. Also  has a past surgical history that includes Cataract extraction.  Objective:   Vitals: BP 140/84 (BP Location: Right Arm, Patient Position: Sitting, Cuff Size: Normal)   Pulse 66   Temp 98.2 F (36.8 C)   Resp 18   SpO2 97%   Physical Exam  Constitutional: She is oriented to person, place, and time. She appears well-developed and well-nourished.  HENT:  Mouth/Throat: Oropharynx is clear and moist.  Eyes: No scleral icterus.  Cardiovascular: Normal rate, regular rhythm and intact distal pulses.  Exam reveals no gallop and no friction rub.   No murmur heard. Pulmonary/Chest: No respiratory distress. She has no wheezes. She has no rales. She exhibits no tenderness.  Abdominal: Soft. Bowel sounds are normal. She exhibits no distension and no mass. There is no tenderness.  Musculoskeletal: She exhibits no edema.  Neurological: She is alert and oriented to person, place, and time.  Skin: Skin is warm and dry.   ECG interpretation - Possible ST-elevation in leads V2-V3, t-wave inversion in Lead III, t-wave flattening in aVF.  Assessment and Plan :   1. Chest pain, unspecified chest pain type - I discussed differential with patient, the most concerning being MI, ACS. I  counseled patient at length about the risks involving this diagnosis. She refused any further testing beyond ecg, transportation to the hospital for emergent evaluation and treatment. I emphasized to the patient that she needs to go to the hospital and requested she sign an AMA form. She subsequently left without completing this.   Wallis BambergMario Rajesh Wyss, PA-C [facility] [phone] 01/24/2016 5:18 PM

## 2016-02-24 ENCOUNTER — Other Ambulatory Visit: Payer: Self-pay | Admitting: Physician Assistant

## 2016-07-11 ENCOUNTER — Other Ambulatory Visit: Payer: Self-pay | Admitting: Physician Assistant

## 2016-07-16 ENCOUNTER — Other Ambulatory Visit: Payer: Self-pay | Admitting: Physician Assistant

## 2016-07-29 ENCOUNTER — Telehealth: Payer: Self-pay | Admitting: General Practice

## 2016-07-29 NOTE — Telephone Encounter (Signed)
Pt is needing michael clark to give her a return call back she states that it is important but did not go into details as to what the call is regarding just that she needed him to call   Best number [phone]

## 2018-01-13 DIAGNOSIS — Z0000 Encounter for general adult medical examination without abnormal findings: Secondary | ICD-10-CM | POA: Diagnosis not present

## 2018-01-13 DIAGNOSIS — Z79899 Other long term (current) drug therapy: Secondary | ICD-10-CM | POA: Diagnosis not present

## 2018-01-13 DIAGNOSIS — E7800 Pure hypercholesterolemia, unspecified: Secondary | ICD-10-CM | POA: Diagnosis not present

## 2018-01-13 DIAGNOSIS — R5383 Other fatigue: Secondary | ICD-10-CM | POA: Diagnosis not present

## 2018-01-13 DIAGNOSIS — I10 Essential (primary) hypertension: Secondary | ICD-10-CM | POA: Diagnosis not present

## 2018-01-13 DIAGNOSIS — R9431 Abnormal electrocardiogram [ECG] [EKG]: Secondary | ICD-10-CM | POA: Diagnosis not present

## 2018-01-13 DIAGNOSIS — Z131 Encounter for screening for diabetes mellitus: Secondary | ICD-10-CM | POA: Diagnosis not present

## 2018-01-13 DIAGNOSIS — E559 Vitamin D deficiency, unspecified: Secondary | ICD-10-CM | POA: Diagnosis not present

## 2018-02-17 DIAGNOSIS — F0280 Dementia in other diseases classified elsewhere without behavioral disturbance: Secondary | ICD-10-CM | POA: Diagnosis not present

## 2018-02-17 DIAGNOSIS — I10 Essential (primary) hypertension: Secondary | ICD-10-CM | POA: Diagnosis not present

## 2018-02-17 DIAGNOSIS — E7800 Pure hypercholesterolemia, unspecified: Secondary | ICD-10-CM | POA: Diagnosis not present

## 2018-02-17 DIAGNOSIS — Z23 Encounter for immunization: Secondary | ICD-10-CM | POA: Diagnosis not present

## 2018-02-17 DIAGNOSIS — G309 Alzheimer's disease, unspecified: Secondary | ICD-10-CM | POA: Diagnosis not present

## 2018-03-22 DIAGNOSIS — G309 Alzheimer's disease, unspecified: Secondary | ICD-10-CM | POA: Diagnosis not present

## 2018-03-22 DIAGNOSIS — R7303 Prediabetes: Secondary | ICD-10-CM | POA: Diagnosis not present

## 2018-03-22 DIAGNOSIS — I10 Essential (primary) hypertension: Secondary | ICD-10-CM | POA: Diagnosis not present

## 2018-03-22 DIAGNOSIS — F0280 Dementia in other diseases classified elsewhere without behavioral disturbance: Secondary | ICD-10-CM | POA: Diagnosis not present

## 2018-11-29 ENCOUNTER — Telehealth: Payer: Self-pay

## 2018-11-29 ENCOUNTER — Encounter: Payer: Self-pay | Admitting: Family Medicine

## 2018-11-29 NOTE — Telephone Encounter (Signed)
Called patient to do their pre-visit COVID screening.  Call went to voicemail. Unable to do prescreening.  

## 2018-11-30 ENCOUNTER — Ambulatory Visit: Payer: Medicare HMO | Admitting: Family Medicine

## 2020-02-18 ENCOUNTER — Emergency Department (HOSPITAL_COMMUNITY)
Admission: EM | Admit: 2020-02-18 | Discharge: 2020-02-29 | Disposition: A | Discharge status: Home / Self Care | Payer: Medicare Other | Attending: Emergency Medicine | Admitting: Emergency Medicine

## 2020-02-18 ENCOUNTER — Encounter (HOSPITAL_COMMUNITY): Payer: Self-pay | Admitting: *Deleted

## 2020-02-18 ENCOUNTER — Other Ambulatory Visit: Payer: Self-pay

## 2020-02-18 DIAGNOSIS — I10 Essential (primary) hypertension: Secondary | ICD-10-CM | POA: Diagnosis not present

## 2020-02-18 DIAGNOSIS — Z7982 Long term (current) use of aspirin: Secondary | ICD-10-CM | POA: Diagnosis not present

## 2020-02-18 DIAGNOSIS — F0390 Unspecified dementia without behavioral disturbance: Secondary | ICD-10-CM | POA: Diagnosis not present

## 2020-02-18 DIAGNOSIS — Z20822 Contact with and (suspected) exposure to covid-19: Secondary | ICD-10-CM | POA: Insufficient documentation

## 2020-02-18 DIAGNOSIS — Z79899 Other long term (current) drug therapy: Secondary | ICD-10-CM | POA: Diagnosis not present

## 2020-02-18 DIAGNOSIS — R4182 Altered mental status, unspecified: Secondary | ICD-10-CM | POA: Diagnosis not present

## 2020-02-18 LAB — COMPREHENSIVE METABOLIC PANEL
ALT: 18 U/L (ref 0–44)
AST: 23 U/L (ref 15–41)
Albumin: 4.4 g/dL (ref 3.5–5.0)
Alkaline Phosphatase: 57 U/L (ref 38–126)
Anion gap: 9 (ref 5–15)
BUN: 13 mg/dL (ref 8–23)
CO2: 29 mmol/L (ref 22–32)
Calcium: 9.7 mg/dL (ref 8.9–10.3)
Chloride: 101 mmol/L (ref 98–111)
Creatinine, Ser: 1.02 mg/dL — ABNORMAL HIGH (ref 0.44–1.00)
GFR calc Af Amer: 60 mL/min — ABNORMAL LOW (ref >60)
GFR calc non Af Amer: 52 mL/min — ABNORMAL LOW (ref >60)
Glucose, Bld: 103 mg/dL — ABNORMAL HIGH (ref 70–99)
Potassium: 3.8 mmol/L (ref 3.5–5.1)
Sodium: 139 mmol/L (ref 135–145)
Total Bilirubin: 0.5 mg/dL (ref 0.3–1.2)
Total Protein: 7.4 g/dL (ref 6.5–8.1)

## 2020-02-18 LAB — CBC
HCT: 38.6 % (ref 36.0–46.0)
Hemoglobin: 12.2 g/dL (ref 12.0–15.0)
MCH: 28 pg (ref 26.0–34.0)
MCHC: 31.6 g/dL (ref 30.0–36.0)
MCV: 88.7 fL (ref 80.0–100.0)
Platelets: 224 10*3/uL (ref 150–400)
RBC: 4.35 MIL/uL (ref 3.87–5.11)
RDW: 13.9 % (ref 11.5–15.5)
WBC: 6.4 10*3/uL (ref 4.0–10.5)
nRBC: 0 % (ref 0.0–0.2)

## 2020-02-18 NOTE — ED Triage Notes (Signed)
Pt arrives via [facility] from [facility]. Pt was at [employer] shopping and requested a cab. Cab returned back with patient to the [employer] because pt  could not tell them where she lived, had no recollection of the same. Unable to give any contact information. Pt has hx of alzheimer. 150/90, hr 86, 100% 112cbg.

## 2020-02-18 NOTE — ED Triage Notes (Signed)
Pt is alert, ambulatory to triage room. Cannot recall events from tonight, keeps saying she "lives in [place] and visiting family" She is oriented to self, year. Unsure of situation. She says she is upset because she is here.

## 2020-02-19 ENCOUNTER — Emergency Department (HOSPITAL_COMMUNITY): Payer: Medicare Other

## 2020-02-19 DIAGNOSIS — R4182 Altered mental status, unspecified: Secondary | ICD-10-CM | POA: Diagnosis not present

## 2020-02-19 LAB — URINALYSIS, ROUTINE W REFLEX MICROSCOPIC
Bilirubin Urine: NEGATIVE
Glucose, UA: NEGATIVE mg/dL
Ketones, ur: NEGATIVE mg/dL
Nitrite: NEGATIVE
Protein, ur: NEGATIVE mg/dL
Specific Gravity, Urine: 1.023 (ref 1.005–1.030)
pH: 5 (ref 5.0–8.0)

## 2020-02-19 MED ORDER — CEPHALEXIN 250 MG PO CAPS
500.0000 mg | ORAL_CAPSULE | Freq: Two times a day (BID) | ORAL | Status: DC
  Administered 2020-02-19 (×2): 500 mg via ORAL
  Filled 2020-02-19 (×3): qty 2

## 2020-02-19 MED ORDER — HALOPERIDOL LACTATE 5 MG/ML IJ SOLN
2.0000 mg | Freq: Once | INTRAMUSCULAR | Status: AC
  Administered 2020-02-19: 2 mg via INTRAMUSCULAR

## 2020-02-19 NOTE — TOC Initial Note (Addendum)
**Note Gloria-Identified via Obfuscation** Transition of Care [facility]) - Initial/Assessment Note    Patient Details  Name: Gloria Navarro MRN: [id number] Date of Birth: 1940/08/18  Transition of [facility]) CM/SW Contact:    Lockie Pares, RN Phone Number: 02/19/2020, 8:54 AM  Clinical Narrative:                 Called other numbers procured in conjunction with Social work.  Confirmed with patient that her children are Gloria Navarro and Gloria Navarro, she cannot remember the other names.  Gloria Navarro [phone]- fax [phone] disconnected [phone] left confidential message   Barriers to Discharge: Family Issues   Patient Goals and CMS Choice        Expected Discharge Plan and Services                                                Prior Living Arrangements/Services                       Activities of Daily Living      Permission Sought/Granted                  Emotional Assessment              Admission diagnosis:  Possible Altered Patient Active Problem List   Diagnosis Date Noted  . Chest pain 01/24/2011  . Hypertension 01/24/2011  . CARDIAC MURMUR 03/01/2007  . HYPERTENSION 01/25/2007  . GERD 01/25/2007   PCP:  Patient, No Pcp Per Pharmacy:   [id number] - [address] ([place]), [place] - [address] [place]) [place] [address] Phone: [phone] Fax: [phone]  [facility] - [place], [place] - [address] [place] [address] Phone: [phone] Fax: [phone]     Social Determinants of Health (SDOH) Interventions    Readmission Risk Interventions No flowsheet data found.

## 2020-02-19 NOTE — ED Notes (Signed)
Lunch Tray Ordered @ 1715. 

## 2020-02-19 NOTE — TOC Progression Note (Addendum)
Transition of Care (TOC) - Progression Note   Disposition is pending request for capacity to determine if next of kin, HCPOA (if there is one), or APS will need to find a guardian to proceed with memory care placement. Gloria Navarro is alleged HCPOA, requested paperwork.   Patient Details  Name: Gloria Navarro MRN: [id number] Date of Birth: 07-Jul-1940  Transition of Care [facility]) CM/SW Contact  Annalee Genta, [place] Phone Number: 02/19/2020, 9:59 AM  Clinical Narrative:   1:20p: Gloria Navarro [phone] returned phone call. Gloria Needle reports another brother is the Management consultant and will track down their contact information and reach back out. Per Gloria Needle this brother is the HCPOA. CSW informed RNCM and will continue to follow.  1:50p: Received call from Gloria Navarro, reported HCPOA. Per Gloria Navarro they have been working to get patient more supports at home due to worsening dementia. Gloria Navarro reports he had been in [place] up until yesterday morning when he returned to [place]. Gloria Navarro reports when he was down here he reached out to DSS for assistance and they investigating for neglect and closed out the case without assisting with the supports. Gloria Navarro reports he is in support for memory care referrals for the patient and provided additional information for family contacts (updated demographics to include this info). CSW will go ahead and reach out to facility with reported openings, [facility], and will update team and family as case progresses. CSW did discuss alternative dispositions needed if memory care is not feasible.   2:00p: Ref[facility]. Pending review.  Initial documentation/out of date information CSW, RNCM, RN, and MD discussing disposition for patient. At this time patient's capacity is not established, although based on ED presentation this is a likely concern. RNCM has been attempting to reach family to see if HCPOA, or next of kin available for decision  making if physician determines no capacity. CSW received list of memory care units that accept Medicaid and reached out to the following facilities:  [facility]- Currently accepting Medicaid referrals [facility]- Will return call to ED CSW regarding availability [facility]- Call back Monday, no admission staff available on weekends [facility]- Unable to reach liaison, voicemail box full.  CSW continuing to follow for disposition and APS report if unable to find next of kin/HCPOA.    10:45A: CSW discussed case with weekday CSW for likely follow-up. MSW Intern completed APS report and they do not have guardianship (inquired as there is documentation of a past investigation). APS unable to assign social worker until Monday. CSW utilized public records and tried to contact family at the following:  Gloria Navarro  [phone]- Works, no voicemail set up  [phone]- Not in service  [phone]- Works, no Biochemist, clinical up  [phone]- Works, left [employer], awaiting call back.    Gloria Navarro (who co-owns patient's residence with her, but has never resided there)  [phone]- RNCM also attempted, left HIPPA-compliant voicemail Number  registered in 2021  [phone]- Works, no voicemail  [phone]- Not in service  Gloria Needle [facility]) Navarro  [phone]- Works, left HIPPA-compliant voicemail  [phone]- Not in service  [phone]- Not in service      Barriers to Discharge: Family Issues  Expected Discharge Plan and Services  Social Determinants of Health (SDOH) Interventions    Readmission Risk Interventions No flowsheet data found.

## 2020-02-19 NOTE — ED Notes (Signed)
Pt trying to leave, dr. Criss Alvine notified, assessing pt at this time

## 2020-02-19 NOTE — ED Notes (Signed)
Pt up to desk, stating she needs "to get to [place]". She says that she is trying to find her son but she doesn't have the number. She says he "lives in [place]". She insist she has to leave, she "has things to do". Pt redirected to her room.

## 2020-02-19 NOTE — ED Provider Notes (Signed)
[facility] EMERGENCY DEPARTMENT Provider Note   CSN: [id number] Arrival date & time: 02/18/20  2203  LEVEL 5 CAVEAT - DEMENTIA  History Chief Complaint  Patient presents with  . Altered Mental Status    DAWT REEB is a 79 y.o. female.  HPI 79 year old female presents with altered mental status.  She was at [employer] and requested a cab.  The cab picked her up and then eventually brought her back to [employer] because she could not tell them where she lives.  She was then brought here. Chart review indicates some dementia.  The patient has no complaints except stating she wants to leave.  Past Medical History:  Diagnosis Date  . Essential hypertension   . GERD (gastroesophageal reflux disease)   . Left hand paresthesia   . Osteoporosis     Patient Active Problem List   Diagnosis Date Noted  . Chest pain 01/24/2011  . Hypertension 01/24/2011  . CARDIAC MURMUR 03/01/2007  . HYPERTENSION 01/25/2007  . GERD 01/25/2007    Past Surgical History:  Procedure Laterality Date  . CATARACT EXTRACTION       OB History   No obstetric history on file.     Family History  Problem Relation Age of Onset  . Heart attack Mother        Died at age 49 of MI  . Hypertension Mother   . Lung cancer Father   . Healthy Sister   . Hypertension Brother   . Healthy Daughter   . Healthy Son   . Healthy Son   . Colon cancer Neg Hx   . Stomach cancer Neg Hx     Social History   Tobacco Use  . Smoking status: Never Smoker  Substance Use Topics  . Alcohol use: No  . Drug use: No    Home Medications Prior to Admission medications   Medication Sig Start Date End Date Taking? Authorizing Provider  aspirin 81 MG chewable tablet Chew 1 tablet by mouth daily.    [provider]  lisinopril-hydrochlorothiazide (PRINZIDE,ZESTORETIC) 10-12.5 MG tablet TAKE ONE TABLET BY MOUTH ONCE DAILY 07/18/15   Porfirio Oar, PA  carvedilol (COREG) 3.125 MG tablet Take  3.125 mg by mouth 2 (two) times daily with a meal.    08/04/11  [provider]    Allergies    Patient has no known allergies.  Review of Systems   Review of Systems  Unable to perform ROS: Dementia    Physical Exam Updated Vital Signs BP 140/60   Pulse 98   Temp 98.9 F (37.2 C)   Resp 16   SpO2 98%   Physical Exam Vitals and nursing note reviewed.  Constitutional:      General: She is not in acute distress.    Appearance: She is well-developed. She is not ill-appearing or diaphoretic.  HENT:     Head: Normocephalic and atraumatic.     Right Ear: External ear normal.     Left Ear: External ear normal.     Nose: Nose normal.  Eyes:     General:        Right eye: No discharge.        Left eye: No discharge.  Cardiovascular:     Rate and Rhythm: Normal rate and regular rhythm.     Heart sounds: Normal heart sounds.  Pulmonary:     Effort: Pulmonary effort is normal.     Breath sounds: Normal breath sounds.  Abdominal:     Palpations: Abdomen is soft.     Tenderness: There is no abdominal tenderness.  Skin:    General: Skin is warm and dry.  Neurological:     Mental Status: She is alert. She is disoriented.     Comments: Awake, alert, disoriented to situation and time. Oriented to place. CN 3-12 grossly intact. 5/5 strength in all 4 extremities. Grossly normal sensation. Normal ambulation  Psychiatric:        Mood and Affect: Mood is not anxious.     ED Results / Procedures / Treatments   Labs (all labs ordered are listed, but only abnormal results are displayed) Labs Reviewed  COMPREHENSIVE METABOLIC PANEL - Abnormal; Notable for the following components:      Result Value   Glucose, Bld 103 (*)    Creatinine, Ser 1.02 (*)    GFR calc non Af Amer 52 (*)    GFR calc Af Amer 60 (*)    All other components within normal limits  URINALYSIS, ROUTINE W REFLEX MICROSCOPIC - Abnormal; Notable for the following components:   APPearance HAZY (*)    Hgb  urine dipstick MODERATE (*)    Leukocytes,Ua LARGE (*)    Bacteria, UA RARE (*)    All other components within normal limits  URINE CULTURE  CBC    EKG None  Radiology No results found.  Procedures Procedures (including critical care time)  Medications Ordered in ED Medications  cephALEXin (KEFLEX) capsule 500 mg (has no administration in time range)    ED Course  I have reviewed the triage vital signs and the nursing notes.  Pertinent labs & imaging results that were available during my care of the patient were reviewed by me and considered in my medical decision making (see chart for details).    MDM Rules/Calculators/A&P                          I called the only contact listed, who is a neighbor, who did not answer at this time. My suspicion is that her altered mental status is chronic from dementia, which has previously been diagnosed. Urine with leukocytes, will cover for UTI and send for culture but it's hard to tell given her confusion if she's symptomatic. However, with normal vital signs and normal WBC, I doubt this is urosepsis.  However I don't think she is ok to leave on her own since she can't name her address. Will consult TOC in the morning. CT head, labs personally reviewed.  Final Clinical Impression(s) / ED Diagnoses Final diagnoses:  None    Rx / DC Orders ED Discharge Orders    None       Pricilla Loveless, MD 02/19/20 [phone]

## 2020-02-19 NOTE — ED Provider Notes (Signed)
Patient seen by myself after notification from case manager team regarding home disposition issues.  It is my opinion that the patient is suffering from advanced dementia, which is documented also in her chart.  She is able to perform her daily basic functions but I do not believe it is safe for her to be living by herself.  Her family unfortunately is entirely in [place], and has come occasionally to stay with her, but she does not have permanent monitoring at home.  CM team has talked to family members who believe the patient would be safest in a memory care unit.    The patient herself expresses repeat desires to leave and go home.  I explained I don't think this is safe.  She cannot even remember where she was this morning (at the grocery store) or any events involving this morning.  Unfortunately given this difficult situation, and no immediate disposition available, we will file an IVC to keep the patient monitored in the ED until a safe disposition plan can be determined.   Terald Sleeper, MD 02/19/20 [phone]

## 2020-02-19 NOTE — NC FL2 (Signed)
  [place] MEDICAID FL2 LEVEL OF CARE SCREENING TOOL     IDENTIFICATION  Patient Name: Gloria Navarro Birthdate: 10/22/1940 Sex: female Admission Date (Current Location): 02/18/2020  [facility] and [place] Number:  Producer, television/film/video and Address:  [facility] [place]. [facility], [address] [place], [place] [address]      Provider Number: [id number]  Attending Physician Name and Address:  Default, Provider, MD  Relative Name and Phone Number:       Current Level of Care: Hospital Recommended Level of Care: Memory Care Prior Approval Number:    Date Approved/Denied:   PASRR Number:    Discharge Plan: Other (Comment) (memory care)    Current Diagnoses: Patient Active Problem List   Diagnosis Date Noted  . Chest pain 01/24/2011  . Hypertension 01/24/2011  . CARDIAC MURMUR 03/01/2007  . HYPERTENSION 01/25/2007  . GERD 01/25/2007    Orientation RESPIRATION BLADDER Height & Weight      (Not oriented)  Normal Continent Weight:   Height:     BEHAVIORAL SYMPTOMS/MOOD NEUROLOGICAL BOWEL NUTRITION STATUS  Wanderer   Continent    AMBULATORY STATUS COMMUNICATION OF NEEDS Skin   Independent Verbally Normal                       Personal Care Assistance Level of Assistance  Bathing, Feeding, Dressing Bathing Assistance: Limited assistance Feeding assistance: Independent Dressing Assistance: Limited assistance     Functional Limitations Info             SPECIAL CARE FACTORS FREQUENCY                       Contractures Contractures Info: Not present    Additional Factors Info  Code Status, Allergies Code Status Info: None filed at this time Allergies Info: NKDA           Current Medications (02/19/2020):  This is the current hospital active medication list Current Facility-Administered Medications  Medication Dose Route Frequency Provider Last Rate Last Admin  . cephALEXin (KEFLEX) capsule 500 mg  500 mg Oral Q12H Pricilla Loveless, MD    500 mg at 02/19/20 3810   Current Outpatient Medications  Medication Sig Dispense Refill  . aspirin 81 MG chewable tablet Chew 1 tablet by mouth daily.    Marland Kitchen lisinopril-hydrochlorothiazide (PRINZIDE,ZESTORETIC) 10-12.5 MG tablet TAKE ONE TABLET BY MOUTH ONCE DAILY 90 tablet 0     Discharge Medications: Please see discharge summary for a list of discharge medications.  Relevant Imaging Results:  Relevant Lab Results:   Additional Information    Malin Sambrano Geralynn Ochs, LCSW

## 2020-02-19 NOTE — ED Notes (Signed)
Pt continuously wandering the department searching "for her cab to take her to her moms house". Spoke with PA regarding situation and new orders will be placed.

## 2020-02-19 NOTE — TOC Progression Note (Addendum)
Transition of Care [facility]) - Progression Note    Patient Details  Name: Gloria Navarro MRN: [id number] Date of Birth: Mar 30, 1941  Transition of Care [facility]) CM/SW Contact  Lockie Pares, RN Phone Number: 02/19/2020, 11:49 AM  Clinical Narrative:    Found another family member online associated with the patient [name] [phone] recorded message reveals that it is indeed her phone. I left HIPAA compliant  message for her to call back asap and also texted the phone.      Barriers to Discharge: Family Issues  Expected Discharge Plan and Services                                                 Social Determinants of Health (SDOH) Interventions    Readmission Risk Interventions No flowsheet data found.

## 2020-02-19 NOTE — ED Provider Notes (Signed)
1020: CM Stephanie updated on patient POC. CM continues to search for family, unable to.  All family may be in [place]. No other updates.    Liberty Handy, PA-C 02/19/20 1023    Gerhard Munch, MD 02/19/20 [phone]

## 2020-02-19 NOTE — Care Management (Addendum)
Called [facility] phone again [place],  [phone] it has been disconnected Tried patients home number, went to a standard message where you can leave no message. With looking and searching on internet, came across son Jayonna Meyering phone number [phone] left message. Continue to search for family to assist with the patient.

## 2020-02-20 DIAGNOSIS — R4182 Altered mental status, unspecified: Secondary | ICD-10-CM | POA: Diagnosis not present

## 2020-02-20 LAB — URINE CULTURE

## 2020-02-20 MED ORDER — CEPHALEXIN 250 MG PO CAPS
500.0000 mg | ORAL_CAPSULE | Freq: Two times a day (BID) | ORAL | Status: AC
  Administered 2020-02-20 – 2020-02-22 (×4): 500 mg via ORAL
  Filled 2020-02-20 (×3): qty 2

## 2020-02-20 NOTE — Progress Notes (Addendum)
12:30pm: CSW spoke with Britta Mccreedy at [facility] who states she is agreeable to review this patient's referral.  CSW faxed information to [place] for review.  10am: CSW received call from [place] from [facility] [place] to have the patient's Medicaid file reviewed to determine what type of Medicaid she has. CSW provided [place] with contact information for the patient's son Mardene Speak and daughter Charmaine.  9:30am: CSW spoke with patient's daughter Suan Halter at [phone] to provide her with an update.  CSW attempted to reach Angie at [facility] regarding this patient - no answer so a voicemail was left requesting a return call.  7:50am: CSW spoke with Tammy at [facility] who confirms that the patient's referral was received yesterday and is currently under review at this time.  Edwin Dada, MSW, LCSW-A Transitions of Care  Clinical Social Worker  [facility] Emergency Departments  Medical ICU [phone]

## 2020-02-20 NOTE — Consult Note (Signed)
Please call the daughter Alajiah Dutkiewicz at [phone] for an update please

## 2020-02-20 NOTE — ED Notes (Signed)
Pt transitioned into different room and into a hospital bed in attempts to maintain pt within room/immediate area.

## 2020-02-20 NOTE — ED Notes (Signed)
Requested sitter obtain v/s

## 2020-02-21 DIAGNOSIS — R4182 Altered mental status, unspecified: Secondary | ICD-10-CM | POA: Diagnosis not present

## 2020-02-21 NOTE — Progress Notes (Signed)
CSW received email with a copy of PoA paperwork from son, Maat Kafer. CSW printed and stickered copy and placed in file for addition to Pt file. CSW also forwarded copy of secure email to CSW Edwin Dada. For consideration.

## 2020-02-21 NOTE — ED Notes (Signed)
Patient took PO meds without difficulty. Sitting in recliner chair in room going through her grocery bag. Sitter remains at bedside. NAD noted at this time.

## 2020-02-21 NOTE — ED Notes (Signed)
Patient in room eating dinner. Sitter at bedside. Patient remains confused but is easily redirectable.

## 2020-02-21 NOTE — Progress Notes (Addendum)
CSW spoke with Donovan Kail at APS who states this patient has full adult Medicaid. Donovan Kail states the the patient's son Mardene Speak is considering coming to pick her up and take her back to [place]. Darven to return call to APS after a discussion with his siblings.  Edwin Dada, MSW, LCSW-A Transitions of Care  Clinical Social Worker  [facility] Emergency Departments  Medical ICU [phone]

## 2020-02-22 DIAGNOSIS — R4182 Altered mental status, unspecified: Secondary | ICD-10-CM | POA: Diagnosis not present

## 2020-02-22 MED ORDER — HALOPERIDOL LACTATE 5 MG/ML IJ SOLN
2.0000 mg | Freq: Once | INTRAMUSCULAR | Status: AC
  Administered 2020-02-22: 2 mg via INTRAMUSCULAR
  Filled 2020-02-22: qty 1

## 2020-02-22 NOTE — ED Notes (Signed)
Sitting up, eating, pleasant, no complaints

## 2020-02-22 NOTE — ED Notes (Signed)
Family member called and wanted update. Informed pt resting.

## 2020-02-22 NOTE — ED Provider Notes (Signed)
9:26 AM Patient sitting upright, appears calm, is smiling, has no evidence for distress, vital signs unremarkable.  She is awaiting placement.   Gerhard Munch, MD 02/22/20 [phone]

## 2020-02-22 NOTE — ED Notes (Signed)
Pt is up and walked to bathroom and taking shower. Minimal assistance provided. Staff checking on her to make sure doing ok.

## 2020-02-22 NOTE — ED Notes (Signed)
Patient becoming agitated and trying to leave. Pt not redirectable at this time. Provider made aware. Sitter with patient at this time.

## 2020-02-22 NOTE — Progress Notes (Signed)
CSW spoke with patient's son Oneal Grout who states he will accept the bed offer from [facility].   CSW attempted to reach [place] at [facility] - no answer, no voicemail option available.  Edwin Dada, MSW, LCSW-A Transitions of Care  Clinical Social Worker  [facility] Emergency Departments  Medical ICU [phone]

## 2020-02-23 NOTE — ED Notes (Signed)
Assuming care of patient at this time. Pt is resting in bed with eyes closed. Sitter at bedside for safety. Bed in low position.

## 2020-02-23 NOTE — Progress Notes (Addendum)
2:30pm: CSW received return call from [place] ([phone]) at [facility] who is agreeable to review this patient's information. CSW sent clinical information over for review.  8:30am: CSW spoke with Britta Mccreedy at [facility] - there no female Medicaid beds available at this time.  CSW spoke with Tammy at [facility] Tammy to have DON return call to CSW with a decision.  CSW spoke with Gerre Pebbles at [facility] - the facility does not accept Medicaid.  CSW spoke with Steward Drone at [facility] - the facility does not accept Medicaid.   CSW attempted to reach admissions at [facility] without success - a voicemail was left requesting a return call.  CSW attempted to reach admissions at [facility] without success - a voicemail was left requesting a return call.  Edwin Dada, MSW, LCSW-A Transitions of Care  Clinical Social Worker  [facility] Emergency Departments  Medical ICU [phone]

## 2020-02-23 NOTE — ED Notes (Signed)
Pt ambulated to bathroom with sitter. Pt ambulatory with stand by assistance.

## 2020-02-23 NOTE — ED Notes (Addendum)
Pt resting with bed in low position. Sitter remains at bedside. Pt in NAD.

## 2020-02-24 NOTE — ED Provider Notes (Signed)
Emergency Medicine Observation Re-evaluation Note  Gloria Navarro is a 79 y.o. female, seen on rounds today.  Pt initially presented to the ED for complaints of Altered Mental Status Currently, the patient is calm, alert, no new c/o. Awaiting placement.   Physical Exam  BP 126/76   Pulse (!) 58   Temp 98 F (36.7 C) (Oral)   Resp 18   SpO2 99%  Physical Exam General: alert, content Cardiac: pulses intact Lungs: no increased wob Psych: alert, calm  ED Course / MDM  EKG:    I have reviewed the labs performed to date as well as medications administered while in observation.  Recent changes in the last 24 hours include no new physical c/o. Awaiting ECF placement.  Plan  Current plan is for   ECF placement.     Cathren Laine, MD 02/24/20 1154

## 2020-02-24 NOTE — ED Notes (Signed)
Pt resting comfortably in bed, denies pain at this time.  Respirations are even and non-labored, does not appear in distress.  Skin is warm, dry and intact.   Sitter remains at bedside.

## 2020-02-24 NOTE — Progress Notes (Signed)
CSW spoke to [place] at [facility] @ [phone] who agreed to review Pt. CSW faxed out Pt info to [phone]   CSW spoke with DON of [facility] @[phone] and set up Facetime interview with Pt for Monday 10/11 at 11:30 am. This CSW will take responsibility for that interaction.   CSW spoke with Sunday at [facility] @[phone]. requested further information about Pt and more clinicals. CSW provided clinicals as well as POA information and family contacts for further consideration.

## 2020-02-24 NOTE — Progress Notes (Signed)
CSW attempted to call [facility] to contact [place] in regards to Pt @ [phone]. Unable to reach [place] and unable to leave message. CSW will attempt to reach out again at a later time.

## 2020-02-24 NOTE — ED Notes (Signed)
Ordered breakfast--Strummer Canipe 

## 2020-02-24 NOTE — ED Notes (Signed)
Pt sleeping in bed, does not appear in distress. respirations are even and non-labored

## 2020-02-25 DIAGNOSIS — R4182 Altered mental status, unspecified: Secondary | ICD-10-CM | POA: Diagnosis not present

## 2020-02-25 NOTE — Evaluation (Signed)
Physical Therapy Evaluation Patient Details Name: Gloria Navarro MRN: [id number] DOB: 04-25-1941 Today's Date: 02/25/2020   History of Present Illness  Patient was found at a supermarket confused and unable to say where she lived. She was brought to the ED. She remains confused. PMH osteoperosis, left hand parathesias, demitia   Clinical Impression  Patient has no mobility deficits but does have significant safety deficits. She has decreased awareness of her surroundings and where she is. She lives by herself and is not safe to be home alone. She has no need for skilled therapy but would benefit from SNF placement for safety. She was abel to be re-oriented but followed only 50% of simple commands.     Follow Up Recommendations No PT follow up    Equipment Recommendations       Recommendations for Other Services Rehab consult     Precautions / Restrictions Precautions Precautions: None Restrictions Weight Bearing Restrictions: No      Mobility  Bed Mobility               General bed mobility comments: Patient found standing in the room. She would sit down but did not lie down in bed.   Transfers Overall transfer level: Needs assistance   Transfers: Sit to/from Stand Sit to Stand: Supervision         General transfer comment: supervision for safety. She is steady on her feet but disoriented   Ambulation/Gait Ambulation/Gait assistance: Supervision Gait Distance (Feet): 60 Feet Assistive device: None Gait Pattern/deviations: WFL(Within Functional Limits) Gait velocity: decreased   General Gait Details: as above required supervision for safety. She is disoriented and wandering around her room and into the hallway to go to the bathroom.   Stairs            Wheelchair Mobility    Modified Rankin (Stroke Patients Only)       Balance Overall balance assessment: No apparent balance deficits (not formally assessed)                                            Pertinent Vitals/Pain Pain Assessment: No/denies pain    Home Living Family/patient expects to be discharged to:: Skilled nursing facility                 Additional Comments: Patient can not give a clear answer to where she lived and if she lived with anyone. Per chart she lived alone with her entrie family living in [place].     Prior Function Level of Independence: Independent         Comments: was living alone. unclear how safe she was      Hand Dominance        Extremity/Trunk Assessment   Upper Extremity Assessment Upper Extremity Assessment: Difficult to assess due to impaired cognition    Lower Extremity Assessment Lower Extremity Assessment: Difficult to assess due to impaired cognition    Cervical / Trunk Assessment Cervical / Trunk Assessment: Normal  Communication   Communication: No difficulties  Cognition Arousal/Alertness: Awake/alert Behavior During Therapy: Anxious;Restless Overall Cognitive Status: No family/caregiver present to determine baseline cognitive functioning                                 General Comments: Patient requires frequent cuing to stay  on tasks. She is oriented to herself but nothing else. She is pleasent and easily re-oriented buut frequently stands and wanders around the room       General Comments      Exercises     Assessment/Plan    PT Assessment Patent does not need any further PT services  PT Problem List         PT Treatment Interventions      PT Goals (Current goals can be found in the Care Plan section)  Acute Rehab PT Goals Patient Stated Goal: to go home  PT Goal Formulation: With patient Time For Goal Achievement: 03/03/20 Potential to Achieve Goals: Good    Frequency     Barriers to discharge        Co-evaluation               AM-PAC PT "6 Clicks" Mobility  Outcome Measure Help needed turning from your back to your side while in a flat bed  without using bedrails?: None Help needed moving from lying on your back to sitting on the side of a flat bed without using bedrails?: None Help needed moving to and from a bed to a chair (including a wheelchair)?: None Help needed standing up from a chair using your arms (e.g., wheelchair or bedside chair)?: None Help needed to walk in hospital room?: None   6 Click Score: 20    End of Session   Activity Tolerance: Patient tolerated treatment well Patient left: in bed;with call bell/phone within reach;with nursing/sitter in room Nurse Communication: Mobility status PT Visit Diagnosis: Other abnormalities of gait and mobility (R26.89)    Time: 2409-7353 PT Time Calculation (min) (ACUTE ONLY): 18 min   Charges:   PT Evaluation $PT Eval Moderate Complexity: 1 Mod             Dessie Coma PT DPT  02/25/2020, 3:51 PM

## 2020-02-25 NOTE — NC FL2 (Signed)
°  [place] MEDICAID FL2 LEVEL OF CARE SCREENING TOOL     IDENTIFICATION  Patient Name: Gloria Navarro Birthdate: 08/29/1940 Sex: female Admission Date (Current Location): 02/18/2020  [facility] and [place] Number:  Producer, television/film/video and Address:  The [place]. [facility], [address] [place], [place] [address]      Provider Number: [id number]  Attending Physician Name and Address:  Default, Provider, MD  Relative Name and Phone Number:       Current Level of Care: Hospital Recommended Level of Care: Skilled Nursing Facility Prior Approval Number:    Date Approved/Denied:   PASRR Number: [id number]  Discharge Plan: Other (Comment) (memory care)    Current Diagnoses: Patient Active Problem List   Diagnosis Date Noted   Chest pain 01/24/2011   Hypertension 01/24/2011   CARDIAC MURMUR 03/01/2007   HYPERTENSION 01/25/2007   GERD 01/25/2007    Orientation RESPIRATION BLADDER Height & Weight      (Not oriented)  Normal Continent Weight:   Height:     BEHAVIORAL SYMPTOMS/MOOD NEUROLOGICAL BOWEL NUTRITION STATUS  Wanderer   Continent    AMBULATORY STATUS COMMUNICATION OF NEEDS Skin   Limited Assist Verbally Normal                       Personal Care Assistance Level of Assistance  Bathing, Feeding, Dressing Bathing Assistance: Limited assistance Feeding assistance: Independent Dressing Assistance: Limited assistance     Functional Limitations Info             SPECIAL CARE FACTORS FREQUENCY  PT (By licensed PT), OT (By licensed OT)     PT Frequency: 5x weekly OT Frequency: 5x weekly            Contractures Contractures Info: Not present    Additional Factors Info  Code Status, Allergies Code Status Info: None filed at this time Allergies Info: NKDA           Current Medications (02/25/2020):  This is the current hospital active medication list No current facility-administered medications for this encounter.    Current Outpatient Medications  Medication Sig Dispense Refill   lisinopril-hydrochlorothiazide (PRINZIDE,ZESTORETIC) 10-12.5 MG tablet TAKE ONE TABLET BY MOUTH ONCE DAILY (Patient not taking: Reported on 02/19/2020) 90 tablet 0     Discharge Medications: Please see discharge summary for a list of discharge medications.  Relevant Imaging Results:  Relevant Lab Results:   Additional Information    Qualyn Oyervides Geralynn Ochs, LCSW

## 2020-02-25 NOTE — Progress Notes (Signed)
CSW notes planned memory care interview Monday. CSW notes PT evaluated and recommended patient would likely benefit with SNF placement. CSW will refer for SNF placements and follow-up with possible transfer discussions as needed. Interview Monday will remain in place unless no longer seen to be feasible.   Rosette Reveal, LCSW, LCAS Clinical Social Worker II Emergency Department, [facility] Transitions of Care Department, [facility] [phone]

## 2020-02-25 NOTE — ED Notes (Signed)
The sitter has helped the pt take a shower bed lenin changed

## 2020-02-25 NOTE — ED Notes (Signed)
The pt is not wanting to stay in her room  shes dressed and carrying her bag around

## 2020-02-25 NOTE — ED Notes (Signed)
Breakfast Ordered 

## 2020-02-25 NOTE — Progress Notes (Signed)
MSW Intern contacted [facility], general admissions line @[phone] ; spoke with ; advised of current need for placement. Patient name / DOB confirmed with Marcelino Duster so she could get patient's information through Epic. Marcelino Duster advised that she will send patient information out to the team to see if a bariatric bed is available in [place] or Marcelino Duster.

## 2020-02-26 DIAGNOSIS — R4182 Altered mental status, unspecified: Secondary | ICD-10-CM | POA: Diagnosis not present

## 2020-02-26 MED ORDER — LORAZEPAM 1 MG PO TABS
0.5000 mg | ORAL_TABLET | Freq: Once | ORAL | Status: AC
  Administered 2020-02-26: 0.5 mg via ORAL
  Filled 2020-02-26: qty 1

## 2020-02-26 MED ORDER — RISPERIDONE 0.25 MG PO TABS
0.2500 mg | ORAL_TABLET | Freq: Every day | ORAL | Status: DC
  Administered 2020-02-26 – 2020-02-29 (×4): 0.25 mg via ORAL
  Filled 2020-02-26 (×5): qty 1

## 2020-02-26 NOTE — ED Provider Notes (Signed)
Emergency Medicine Observation Re-evaluation Note  Gloria Navarro is a 79 y.o. female, seen on rounds today.  Pt initially presented to the ED for complaints of Altered Mental Status   Physical Exam  BP 101/80 (BP Location: Right Arm)   Pulse 87   Temp 98 F (36.7 C) (Oral)   Resp 20   SpO2 99%  Physical Exam Patient is in room walking around picking at things.  Listening to music.  ED Course / MDM  EKG:      Plan  Discussed with social work.  Patient's IVC is expiring.  Is been 7 days.  However this appears to be dementia and not an acute psychiatric disorder.  Pending placement.  Does not need to continue the IVC at this time.   Benjiman Core, MD 02/26/20 [phone]

## 2020-02-26 NOTE — ED Notes (Signed)
Sitter at bedside.

## 2020-02-26 NOTE — ED Notes (Signed)
Breakfast Ordered 

## 2020-02-26 NOTE — ED Notes (Signed)
Discussed need to renew IVC paperwork with Dr.Pickering.

## 2020-02-26 NOTE — ED Provider Notes (Signed)
Emergency Medicine Observation Re-evaluation Note  Gloria Navarro is a 79 y.o. female, seen on rounds today.  Pt initially presented to the ED for complaints of Altered Mental Status Currently, the patient is sleeping in her room.  Physical Exam  BP 101/80 (BP Location: Right Arm)   Pulse 87   Temp 98 F (36.7 C) (Oral)   Resp 20   SpO2 99%  Physical Exam Sleeping, respirations are even and unlabored.  ED Course / MDM  EKG:    I have reviewed the labs performed to date as well as medications administered while in observation.  Recent changes in the last 24 hours include increased agitation, has not slept in 2 days.  Plan  Current plan is for admit to SNF. Discussed patient's agitation with Dr. Dalene Seltzer, order given for Risperdal, on recheck, patient is sleeping in her room.   Jeannie Fend, PA-C 02/26/20 1315    Alvira Monday, MD 02/28/20 1521

## 2020-02-26 NOTE — ED Notes (Addendum)
Dr. Rubin Payor states to let IVC paper work to expire. Not meeting psych criteria.

## 2020-02-26 NOTE — ED Notes (Signed)
Belongings placed in purple pod locker #4.  Including bad with coin wallet,medical cards, pants and hat.   Pt continues to wear her sweater over her hospital attire

## 2020-02-26 NOTE — ED Notes (Signed)
Pt in bed asleep, respirations are even and non-labored  Pt does not appear in distress.

## 2020-02-26 NOTE — ED Notes (Signed)
Pt continues to stand at door trying to leave. Upset with staff, staff is unable to redirect pt. Pt refusing to sit but is unsteady on feet.  States "I am tired I need to go home.

## 2020-02-26 NOTE — ED Notes (Signed)
The pt has not slept at all she has been dressed and walked around the station

## 2020-02-26 NOTE — ED Notes (Signed)
Pt awake and out of her room. Stating she needs to go home and to the store.  Pt has not been to sleep tonight per sitter.   Pt getting upset with staff for keeping her here and attempting to walk out of unit.  Provider aware to order medications for pt.   Pt is steady on feet. respirations are even and non-labored  Skins is warm, dry and intact

## 2020-02-26 NOTE — ED Notes (Signed)
Pt standing at door to room with stuff in her room stating that she needs to leave and is going to the store.  Pt informed that she can not leave and she is a pt here and must stay in her room.  Pt starting to get aggressive with staff pushing them out of her way and swatting at them when they try guide her back to her room.  Provider Informed on pt condition.

## 2020-02-27 DIAGNOSIS — R4182 Altered mental status, unspecified: Secondary | ICD-10-CM | POA: Diagnosis not present

## 2020-02-27 LAB — RESPIRATORY PANEL BY RT PCR (FLU A&B, COVID)
Influenza A by PCR: NEGATIVE
Influenza B by PCR: NEGATIVE
SARS Coronavirus 2 by RT PCR: NEGATIVE

## 2020-02-27 NOTE — Progress Notes (Signed)
CSW received call from Pt daughter Suan Halter @[phone] Daughter states that she has medical records that she wishes to have included in Pt record that include pertinent diagnoses. CSW gave her fax # to send those records.

## 2020-02-27 NOTE — Progress Notes (Signed)
CSW spoke with son Gloria Navarro and updated as to current options.  [facility] interview was unsuccessful. Pt received bed offer from [facility] for SNF.  Son agreed to [facility] plan. CSW  Informed [facility] admissions. CP will begin insurance auth.  [facility] requests that they receive: AVS Meds List Hard scripts Before Pt arrives

## 2020-02-27 NOTE — ED Notes (Signed)
RN assumes care of pt , pt denies any needs at this time. Sitter at bedside, RN will continue to monitor

## 2020-02-27 NOTE — Progress Notes (Signed)
CSW spoke with POA Rica Records to update him about today's Facetime interview with [facility]. POA expressed agreement to plan and interview.

## 2020-02-28 DIAGNOSIS — R4182 Altered mental status, unspecified: Secondary | ICD-10-CM | POA: Diagnosis not present

## 2020-02-28 MED ORDER — LORAZEPAM 1 MG PO TABS
1.0000 mg | ORAL_TABLET | Freq: Once | ORAL | Status: AC
  Administered 2020-02-28: 1 mg via ORAL
  Filled 2020-02-28: qty 1

## 2020-02-28 NOTE — ED Provider Notes (Signed)
6:01 PM Approached by RN stating patient up in hallway, mildly agitated, trying to leave. Plan for SNF, IVC has lapsed. She is receiving risperdal daily. I have ordered 1mg  ativan to help with agitation.   BP 123/74 (BP Location: Right Arm)   Pulse 74   Temp 98 F (36.7 C) (Oral)   Resp 18   SpO2 99%     , PA-C 02/28/20 2258    2259, MD 02/29/20 1335

## 2020-02-28 NOTE — ED Notes (Signed)
Pt agitated attempting to leave room. Sister and Rn difficulty redirecting pt back to room. After several minutes pt returned to room. Given PO ativan.

## 2020-02-28 NOTE — Progress Notes (Addendum)
10:45am: CSW spoke with patient's daughter Charmaine to provide her with an update regarding the discharge plan to SNF.   9am: CSW spoke with Inetta Fermo at [place] - she will initiate insurance authorization for this patient.  CSW spoke with the patient's son Oneal Grout who is agreeable to placement at [facility].   Edwin Dada, MSW, LCSW-A Transitions of Care  Clinical Social Worker  [facility] Emergency Departments  Medical ICU [phone]

## 2020-02-28 NOTE — ED Notes (Signed)
Pt has been awake all shift. Continues to pace room and is upset that she is here at this hospital. Pt continues to state she wants to go home. Sitter at door impeding exit. Pt redirectable

## 2020-02-29 DIAGNOSIS — R4182 Altered mental status, unspecified: Secondary | ICD-10-CM | POA: Diagnosis not present

## 2020-02-29 NOTE — ED Notes (Signed)
Patient standing at doorway of her room, stating she needs to leave to "go to the [facility] home" redirection attempted by sitter and this RN. Pt remains calm, but is disoriented to situation at this time.

## 2020-02-29 NOTE — ED Notes (Signed)
Patient continues to pace around her bed, quietly.

## 2020-02-29 NOTE — ED Provider Notes (Signed)
Blood pressure (!) 141/96, pulse 85, temperature 98.8 F (37.1 C), temperature source Oral, resp. rate 18, SpO2 100 %.  In short, Gloria Navarro is a 79 y.o. female with a chief complaint of Altered Mental Status .  Refer to the original H&P for additional details.  10:56 AM  Patient is being transported to a nursing facility today.  She is well-appearing and ready for discharge.  Her IVC has expired and the order was discontinued from her chart.     Maia Plan, MD 02/29/20 1057

## 2020-02-29 NOTE — Discharge Instructions (Addendum)
You are going to [facility] for further management of your health.

## 2020-02-29 NOTE — ED Notes (Signed)
Called [facility] for Transport  °

## 2020-02-29 NOTE — Progress Notes (Addendum)
CSW spoke with Gloria Navarro in admissions at [facility] - the patient will go to room 121 at [facility]. The patient will be transported via [place], RN to call when ready. The number to call for report is [phone].  CSW notified patient's son Oneal Grout of discharge.  Edwin Dada, MSW, LCSW-A Transitions of Care  Clinical Social Worker  [facility] Emergency Departments  Medical ICU [phone]

## 2020-02-29 NOTE — ED Notes (Addendum)
Patient continues to stand at doorway of room, does not want to sit or lie down on chair or stretcher, attempting to come out into hallway and walk towards exit of zone. Patient brought next to nurses station to sit next to sitter and RN for safety. Given paper and crayon to color/write. Pt calm and cooperative at this time.

## 2020-02-29 NOTE — ED Notes (Signed)
Ordered breakfast--Gloria Navarro 

## 2020-05-22 DIAGNOSIS — K219 Gastro-esophageal reflux disease without esophagitis: Secondary | ICD-10-CM | POA: Diagnosis not present

## 2020-05-22 DIAGNOSIS — E559 Vitamin D deficiency, unspecified: Secondary | ICD-10-CM | POA: Diagnosis not present

## 2020-05-22 DIAGNOSIS — M6281 Muscle weakness (generalized): Secondary | ICD-10-CM | POA: Diagnosis not present

## 2020-06-07 DIAGNOSIS — R5381 Other malaise: Secondary | ICD-10-CM | POA: Diagnosis not present

## 2020-06-07 DIAGNOSIS — E559 Vitamin D deficiency, unspecified: Secondary | ICD-10-CM | POA: Diagnosis not present

## 2020-06-07 DIAGNOSIS — I10 Essential (primary) hypertension: Secondary | ICD-10-CM | POA: Diagnosis not present

## 2020-06-07 DIAGNOSIS — M810 Age-related osteoporosis without current pathological fracture: Secondary | ICD-10-CM | POA: Diagnosis not present

## 2020-06-07 DIAGNOSIS — K219 Gastro-esophageal reflux disease without esophagitis: Secondary | ICD-10-CM | POA: Diagnosis not present

## 2020-06-07 DIAGNOSIS — R202 Paresthesia of skin: Secondary | ICD-10-CM | POA: Diagnosis not present

## 2020-06-18 DIAGNOSIS — M810 Age-related osteoporosis without current pathological fracture: Secondary | ICD-10-CM | POA: Diagnosis not present

## 2020-06-18 DIAGNOSIS — I10 Essential (primary) hypertension: Secondary | ICD-10-CM | POA: Diagnosis not present

## 2020-06-18 DIAGNOSIS — E559 Vitamin D deficiency, unspecified: Secondary | ICD-10-CM | POA: Diagnosis not present

## 2020-06-19 DIAGNOSIS — I10 Essential (primary) hypertension: Secondary | ICD-10-CM | POA: Diagnosis not present

## 2020-06-19 DIAGNOSIS — M810 Age-related osteoporosis without current pathological fracture: Secondary | ICD-10-CM | POA: Diagnosis not present

## 2020-06-19 DIAGNOSIS — K219 Gastro-esophageal reflux disease without esophagitis: Secondary | ICD-10-CM | POA: Diagnosis not present

## 2020-06-27 DIAGNOSIS — M810 Age-related osteoporosis without current pathological fracture: Secondary | ICD-10-CM | POA: Diagnosis not present

## 2020-06-27 DIAGNOSIS — E559 Vitamin D deficiency, unspecified: Secondary | ICD-10-CM | POA: Diagnosis not present

## 2020-06-27 DIAGNOSIS — I10 Essential (primary) hypertension: Secondary | ICD-10-CM | POA: Diagnosis not present

## 2020-07-05 DIAGNOSIS — K219 Gastro-esophageal reflux disease without esophagitis: Secondary | ICD-10-CM | POA: Diagnosis not present

## 2020-07-05 DIAGNOSIS — M810 Age-related osteoporosis without current pathological fracture: Secondary | ICD-10-CM | POA: Diagnosis not present

## 2020-07-05 DIAGNOSIS — I10 Essential (primary) hypertension: Secondary | ICD-10-CM | POA: Diagnosis not present

## 2020-08-02 DIAGNOSIS — K219 Gastro-esophageal reflux disease without esophagitis: Secondary | ICD-10-CM | POA: Diagnosis not present

## 2020-08-02 DIAGNOSIS — M810 Age-related osteoporosis without current pathological fracture: Secondary | ICD-10-CM | POA: Diagnosis not present

## 2020-08-02 DIAGNOSIS — G5602 Carpal tunnel syndrome, left upper limb: Secondary | ICD-10-CM | POA: Diagnosis not present

## 2020-08-02 DIAGNOSIS — I10 Essential (primary) hypertension: Secondary | ICD-10-CM | POA: Diagnosis not present

## 2020-08-28 DIAGNOSIS — R278 Other lack of coordination: Secondary | ICD-10-CM | POA: Diagnosis not present

## 2020-08-29 DIAGNOSIS — M6281 Muscle weakness (generalized): Secondary | ICD-10-CM | POA: Diagnosis not present

## 2020-08-29 DIAGNOSIS — R2689 Other abnormalities of gait and mobility: Secondary | ICD-10-CM | POA: Diagnosis not present

## 2020-08-29 DIAGNOSIS — R278 Other lack of coordination: Secondary | ICD-10-CM | POA: Diagnosis not present

## 2020-08-29 DIAGNOSIS — M542 Cervicalgia: Secondary | ICD-10-CM | POA: Diagnosis not present

## 2020-08-30 DIAGNOSIS — R278 Other lack of coordination: Secondary | ICD-10-CM | POA: Diagnosis not present

## 2020-09-03 DIAGNOSIS — M542 Cervicalgia: Secondary | ICD-10-CM | POA: Diagnosis not present

## 2020-09-03 DIAGNOSIS — M6281 Muscle weakness (generalized): Secondary | ICD-10-CM | POA: Diagnosis not present

## 2020-09-03 DIAGNOSIS — R278 Other lack of coordination: Secondary | ICD-10-CM | POA: Diagnosis not present

## 2020-09-03 DIAGNOSIS — R2689 Other abnormalities of gait and mobility: Secondary | ICD-10-CM | POA: Diagnosis not present

## 2020-09-04 DIAGNOSIS — R278 Other lack of coordination: Secondary | ICD-10-CM | POA: Diagnosis not present

## 2020-09-05 DIAGNOSIS — M542 Cervicalgia: Secondary | ICD-10-CM | POA: Diagnosis not present

## 2020-09-05 DIAGNOSIS — R278 Other lack of coordination: Secondary | ICD-10-CM | POA: Diagnosis not present

## 2020-09-05 DIAGNOSIS — R2689 Other abnormalities of gait and mobility: Secondary | ICD-10-CM | POA: Diagnosis not present

## 2020-09-05 DIAGNOSIS — M6281 Muscle weakness (generalized): Secondary | ICD-10-CM | POA: Diagnosis not present

## 2020-09-06 DIAGNOSIS — R278 Other lack of coordination: Secondary | ICD-10-CM | POA: Diagnosis not present

## 2020-09-10 DIAGNOSIS — R278 Other lack of coordination: Secondary | ICD-10-CM | POA: Diagnosis not present

## 2020-09-10 DIAGNOSIS — R2689 Other abnormalities of gait and mobility: Secondary | ICD-10-CM | POA: Diagnosis not present

## 2020-09-10 DIAGNOSIS — M6281 Muscle weakness (generalized): Secondary | ICD-10-CM | POA: Diagnosis not present

## 2020-09-10 DIAGNOSIS — M542 Cervicalgia: Secondary | ICD-10-CM | POA: Diagnosis not present

## 2020-09-11 DIAGNOSIS — R278 Other lack of coordination: Secondary | ICD-10-CM | POA: Diagnosis not present

## 2020-09-12 DIAGNOSIS — R278 Other lack of coordination: Secondary | ICD-10-CM | POA: Diagnosis not present

## 2020-09-12 DIAGNOSIS — M542 Cervicalgia: Secondary | ICD-10-CM | POA: Diagnosis not present

## 2020-09-12 DIAGNOSIS — R2689 Other abnormalities of gait and mobility: Secondary | ICD-10-CM | POA: Diagnosis not present

## 2020-09-12 DIAGNOSIS — M6281 Muscle weakness (generalized): Secondary | ICD-10-CM | POA: Diagnosis not present

## 2020-09-13 DIAGNOSIS — R278 Other lack of coordination: Secondary | ICD-10-CM | POA: Diagnosis not present

## 2020-09-18 DIAGNOSIS — R278 Other lack of coordination: Secondary | ICD-10-CM | POA: Diagnosis not present

## 2020-09-19 DIAGNOSIS — M6281 Muscle weakness (generalized): Secondary | ICD-10-CM | POA: Diagnosis not present

## 2020-09-19 DIAGNOSIS — R2689 Other abnormalities of gait and mobility: Secondary | ICD-10-CM | POA: Diagnosis not present

## 2020-09-19 DIAGNOSIS — R278 Other lack of coordination: Secondary | ICD-10-CM | POA: Diagnosis not present

## 2020-09-19 DIAGNOSIS — M542 Cervicalgia: Secondary | ICD-10-CM | POA: Diagnosis not present

## 2020-09-20 DIAGNOSIS — R278 Other lack of coordination: Secondary | ICD-10-CM | POA: Diagnosis not present

## 2020-09-22 DIAGNOSIS — R2689 Other abnormalities of gait and mobility: Secondary | ICD-10-CM | POA: Diagnosis not present

## 2020-09-22 DIAGNOSIS — M542 Cervicalgia: Secondary | ICD-10-CM | POA: Diagnosis not present

## 2020-09-22 DIAGNOSIS — M6281 Muscle weakness (generalized): Secondary | ICD-10-CM | POA: Diagnosis not present

## 2020-09-22 DIAGNOSIS — R278 Other lack of coordination: Secondary | ICD-10-CM | POA: Diagnosis not present

## 2020-09-24 DIAGNOSIS — M542 Cervicalgia: Secondary | ICD-10-CM | POA: Diagnosis not present

## 2020-09-24 DIAGNOSIS — M6281 Muscle weakness (generalized): Secondary | ICD-10-CM | POA: Diagnosis not present

## 2020-09-24 DIAGNOSIS — R2689 Other abnormalities of gait and mobility: Secondary | ICD-10-CM | POA: Diagnosis not present

## 2020-09-24 DIAGNOSIS — R278 Other lack of coordination: Secondary | ICD-10-CM | POA: Diagnosis not present

## 2020-09-25 DIAGNOSIS — R278 Other lack of coordination: Secondary | ICD-10-CM | POA: Diagnosis not present

## 2020-09-26 DIAGNOSIS — M542 Cervicalgia: Secondary | ICD-10-CM | POA: Diagnosis not present

## 2020-09-26 DIAGNOSIS — M6281 Muscle weakness (generalized): Secondary | ICD-10-CM | POA: Diagnosis not present

## 2020-09-26 DIAGNOSIS — R2689 Other abnormalities of gait and mobility: Secondary | ICD-10-CM | POA: Diagnosis not present

## 2020-09-26 DIAGNOSIS — R278 Other lack of coordination: Secondary | ICD-10-CM | POA: Diagnosis not present

## 2020-09-27 DIAGNOSIS — R278 Other lack of coordination: Secondary | ICD-10-CM | POA: Diagnosis not present

## 2020-10-01 DIAGNOSIS — M542 Cervicalgia: Secondary | ICD-10-CM | POA: Diagnosis not present

## 2020-10-01 DIAGNOSIS — M6281 Muscle weakness (generalized): Secondary | ICD-10-CM | POA: Diagnosis not present

## 2020-10-01 DIAGNOSIS — R278 Other lack of coordination: Secondary | ICD-10-CM | POA: Diagnosis not present

## 2020-10-01 DIAGNOSIS — R2689 Other abnormalities of gait and mobility: Secondary | ICD-10-CM | POA: Diagnosis not present

## 2020-10-03 DIAGNOSIS — R2689 Other abnormalities of gait and mobility: Secondary | ICD-10-CM | POA: Diagnosis not present

## 2020-10-03 DIAGNOSIS — M542 Cervicalgia: Secondary | ICD-10-CM | POA: Diagnosis not present

## 2020-10-03 DIAGNOSIS — M6281 Muscle weakness (generalized): Secondary | ICD-10-CM | POA: Diagnosis not present

## 2020-10-03 DIAGNOSIS — R278 Other lack of coordination: Secondary | ICD-10-CM | POA: Diagnosis not present

## 2020-10-08 DIAGNOSIS — M542 Cervicalgia: Secondary | ICD-10-CM | POA: Diagnosis not present

## 2020-10-08 DIAGNOSIS — R2689 Other abnormalities of gait and mobility: Secondary | ICD-10-CM | POA: Diagnosis not present

## 2020-10-08 DIAGNOSIS — M6281 Muscle weakness (generalized): Secondary | ICD-10-CM | POA: Diagnosis not present

## 2020-10-08 DIAGNOSIS — R278 Other lack of coordination: Secondary | ICD-10-CM | POA: Diagnosis not present

## 2020-10-10 DIAGNOSIS — R2689 Other abnormalities of gait and mobility: Secondary | ICD-10-CM | POA: Diagnosis not present

## 2020-10-10 DIAGNOSIS — R278 Other lack of coordination: Secondary | ICD-10-CM | POA: Diagnosis not present

## 2020-10-10 DIAGNOSIS — M542 Cervicalgia: Secondary | ICD-10-CM | POA: Diagnosis not present

## 2020-10-10 DIAGNOSIS — M6281 Muscle weakness (generalized): Secondary | ICD-10-CM | POA: Diagnosis not present

## 2020-10-14 DIAGNOSIS — R2689 Other abnormalities of gait and mobility: Secondary | ICD-10-CM | POA: Diagnosis not present

## 2020-10-14 DIAGNOSIS — R278 Other lack of coordination: Secondary | ICD-10-CM | POA: Diagnosis not present

## 2020-10-14 DIAGNOSIS — M542 Cervicalgia: Secondary | ICD-10-CM | POA: Diagnosis not present

## 2020-10-14 DIAGNOSIS — M6281 Muscle weakness (generalized): Secondary | ICD-10-CM | POA: Diagnosis not present

## 2020-10-17 DIAGNOSIS — M542 Cervicalgia: Secondary | ICD-10-CM | POA: Diagnosis not present

## 2020-10-17 DIAGNOSIS — R2689 Other abnormalities of gait and mobility: Secondary | ICD-10-CM | POA: Diagnosis not present

## 2020-10-17 DIAGNOSIS — R278 Other lack of coordination: Secondary | ICD-10-CM | POA: Diagnosis not present

## 2020-10-17 DIAGNOSIS — M6281 Muscle weakness (generalized): Secondary | ICD-10-CM | POA: Diagnosis not present

## 2020-10-23 DIAGNOSIS — R011 Cardiac murmur, unspecified: Secondary | ICD-10-CM | POA: Diagnosis not present

## 2020-12-04 DIAGNOSIS — G300 Alzheimer's disease with early onset: Secondary | ICD-10-CM | POA: Diagnosis not present

## 2021-01-24 DIAGNOSIS — I10 Essential (primary) hypertension: Secondary | ICD-10-CM | POA: Diagnosis not present

## 2021-01-24 DIAGNOSIS — U071 COVID-19: Secondary | ICD-10-CM | POA: Diagnosis not present

## 2021-01-24 DIAGNOSIS — M810 Age-related osteoporosis without current pathological fracture: Secondary | ICD-10-CM | POA: Diagnosis not present

## 2021-01-24 DIAGNOSIS — E559 Vitamin D deficiency, unspecified: Secondary | ICD-10-CM | POA: Diagnosis not present

## 2021-01-24 DIAGNOSIS — K219 Gastro-esophageal reflux disease without esophagitis: Secondary | ICD-10-CM | POA: Diagnosis not present

## 2021-01-24 DIAGNOSIS — R202 Paresthesia of skin: Secondary | ICD-10-CM | POA: Diagnosis not present

## 2021-01-30 DIAGNOSIS — U071 COVID-19: Secondary | ICD-10-CM | POA: Diagnosis not present

## 2021-02-12 DIAGNOSIS — U071 COVID-19: Secondary | ICD-10-CM | POA: Diagnosis not present

## 2021-02-13 DIAGNOSIS — M810 Age-related osteoporosis without current pathological fracture: Secondary | ICD-10-CM | POA: Diagnosis not present

## 2021-02-13 DIAGNOSIS — E559 Vitamin D deficiency, unspecified: Secondary | ICD-10-CM | POA: Diagnosis not present

## 2021-02-13 DIAGNOSIS — I10 Essential (primary) hypertension: Secondary | ICD-10-CM | POA: Diagnosis not present

## 2021-02-13 DIAGNOSIS — U071 COVID-19: Secondary | ICD-10-CM | POA: Diagnosis not present

## 2021-02-16 IMAGING — CT CT HEAD W/O CM
4 series · 16 of 47 positions shown, 18 images · non-contrast
Comparison: None.

CLINICAL DATA: Delirium.

EXAM:
CT HEAD WITHOUT CONTRAST
TECHNIQUE: Contiguous axial images were obtained from the base of the skull
through the vertex without intravenous contrast.

[Series 3: head bone · axial · 0.44mm/px · z∈[-159,-127]mm · 3 of 82 slices shown]
[im 9/82  bone]
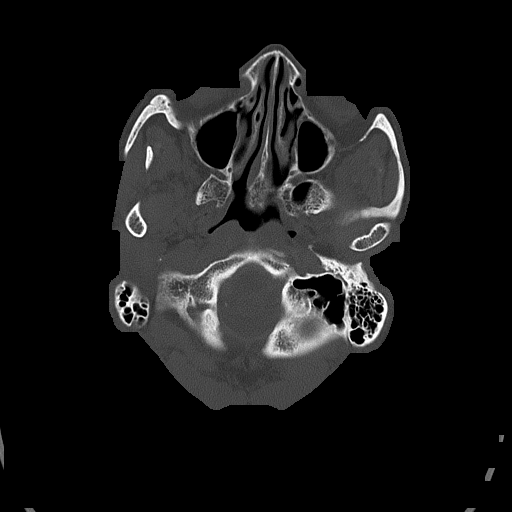
[im 17/82  bone]
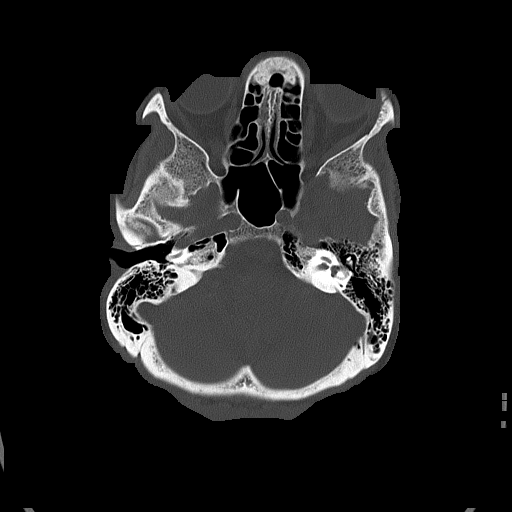
[im 25/82  bone]
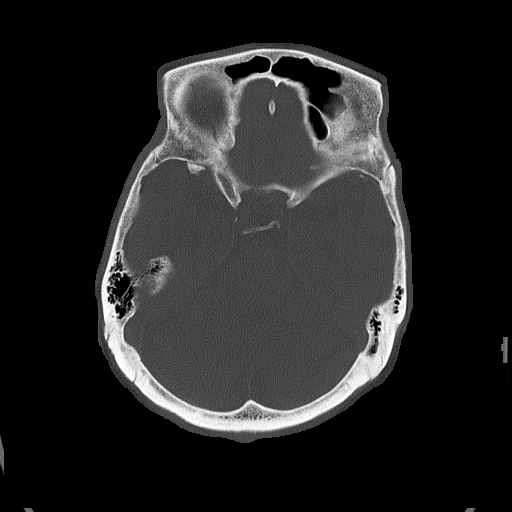

[Series 4: head without · axial · non-contrast · 0.44mm/px · z∈[-155,-35]mm · 7 of 33 slices shown, 9 images]
[im 5/33  brain]
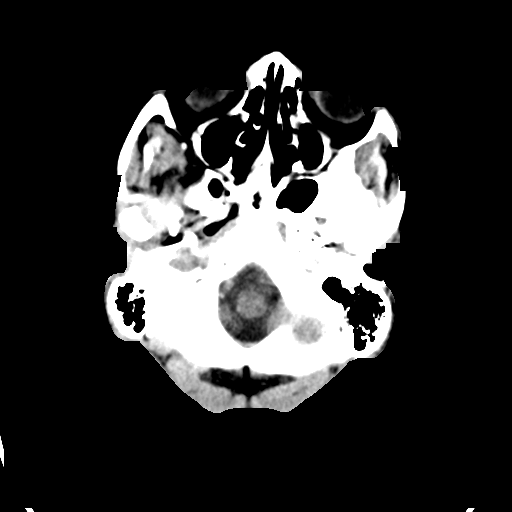
[im 5/33  bone]
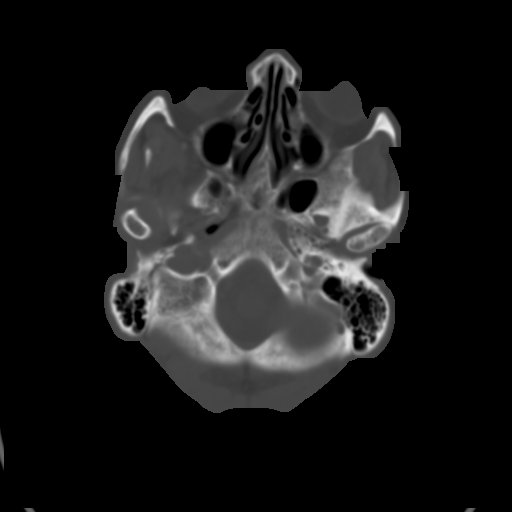
[im 9/33  brain]
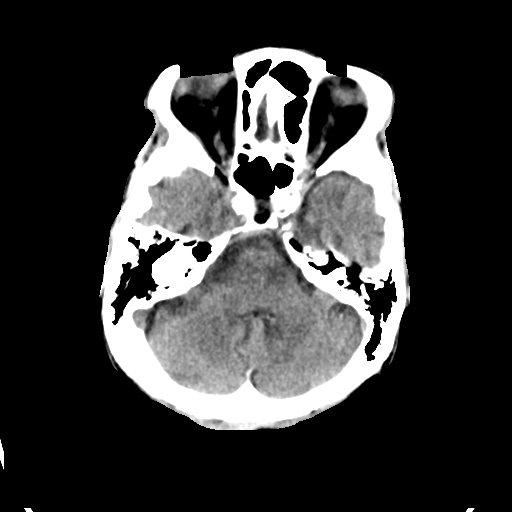
[im 13/33  brain]
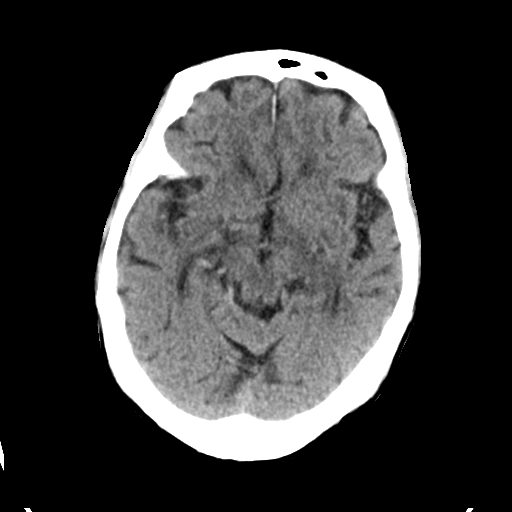
[im 17/33  brain]
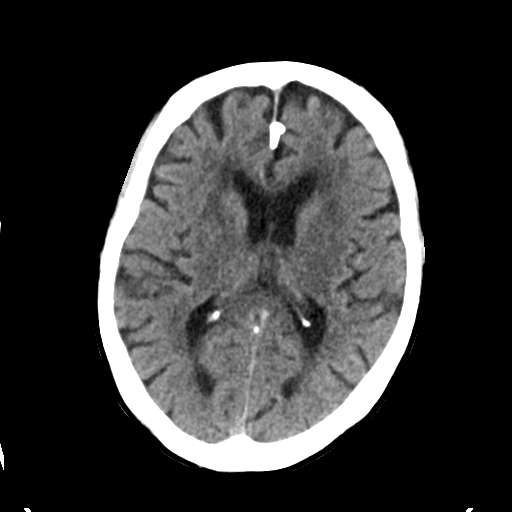
[im 21/33  brain]
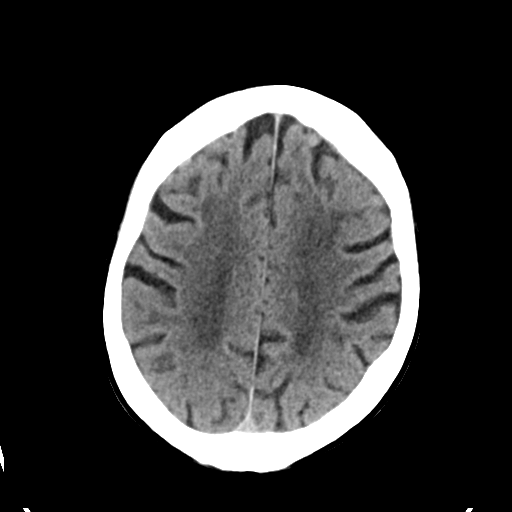
[im 21/33  bone]
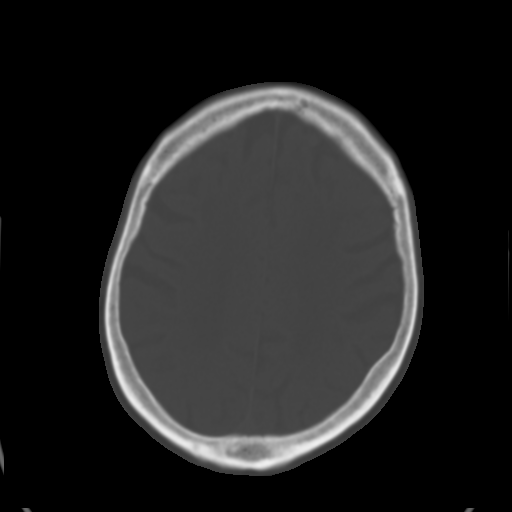
[im 25/33  brain]
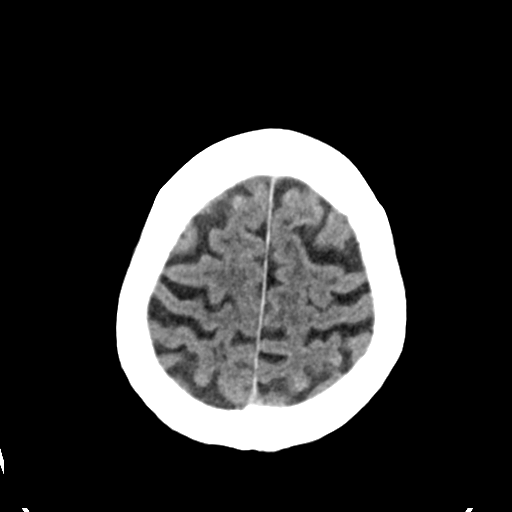
[im 29/33  brain]
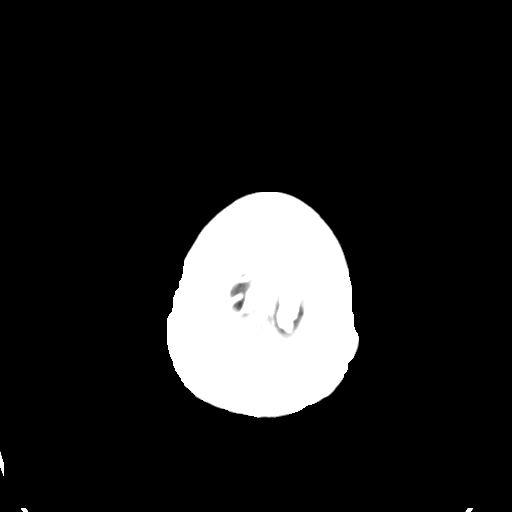

[Series 5: head without cor · coronal · non-contrast · 0.32mm/px · 3 of 67 slices shown]
[im 23/67  brain]
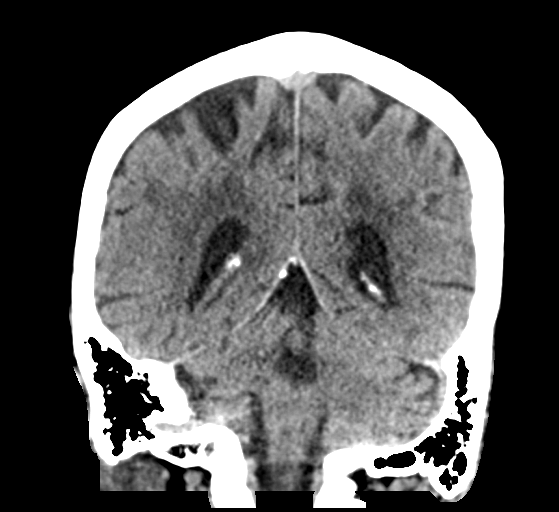
[im 30/67  brain]
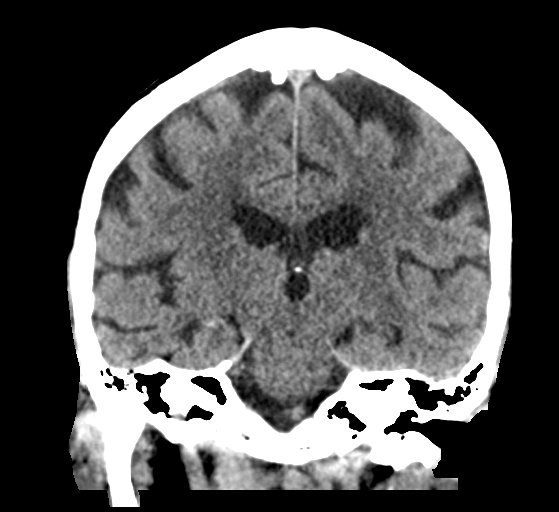
[im 37/67  brain]
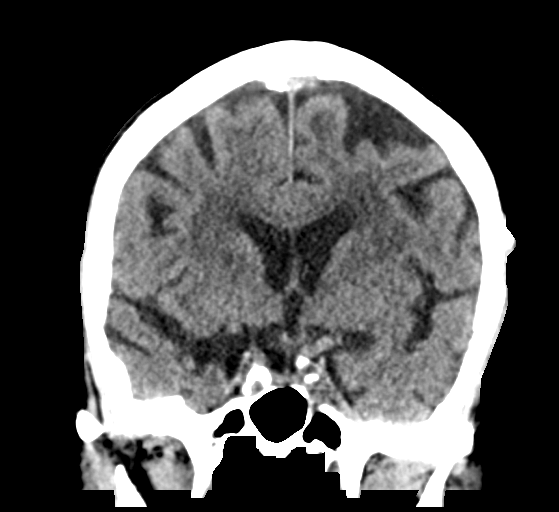

[Series 6: head without sag · sagittal · non-contrast · 0.32mm/px · 3 of 66 slices shown]
[im 22/66  brain]
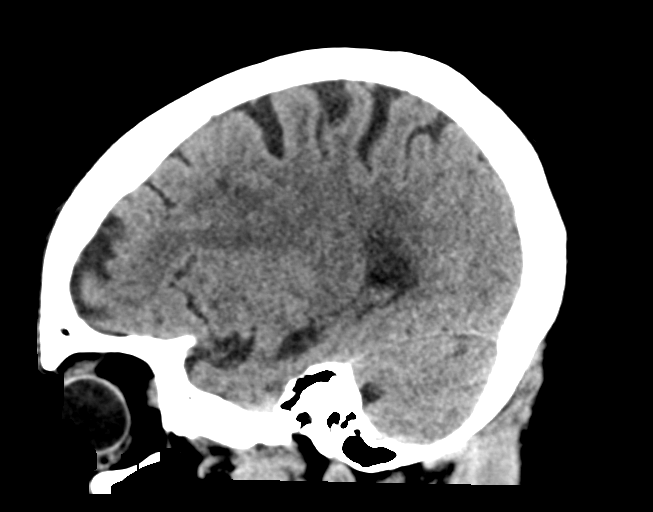
[im 33/66  brain]
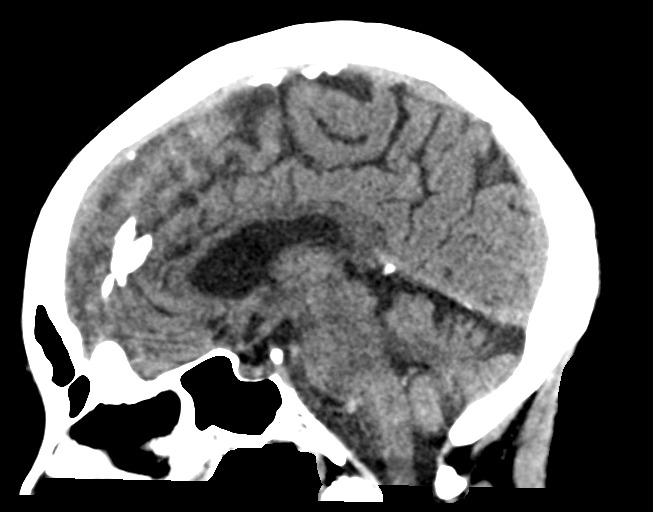
[im 44/66  brain]
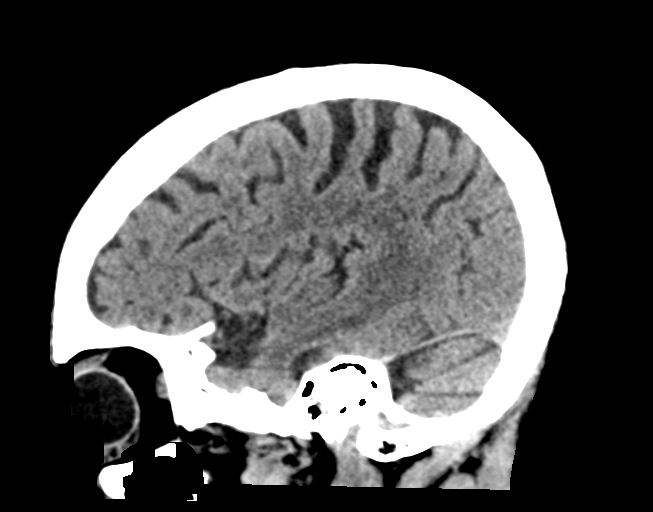

[16 of 47 positions shown; findings below may reference images not displayed]

FINDINGS: Brain: There is no evidence of an acute infarct, intracranial
hemorrhage, mass, midline shift, or extra-axial fluid collection.
Patchy hypodensities in the cerebral white matter bilaterally are
nonspecific but compatible with moderate chronic small vessel
ischemic disease. Mild cerebral atrophy is not advanced for age.

Vascular: Calcified atherosclerosis at the skull base. No hyperdense
vessel.

Skull: No fracture or suspicious osseous lesion.

Sinuses/Orbits: Visualized paranasal sinuses and mastoid air cells
are clear. Bilateral cataract extraction.

Other: 1.4 cm left lateral scalp nodule favored to represent a
trichilemmal cyst.
IMPRESSION: 1. No evidence of acute intracranial abnormality.
2. Moderate chronic small vessel ischemic disease.

## 2021-02-20 DIAGNOSIS — E559 Vitamin D deficiency, unspecified: Secondary | ICD-10-CM | POA: Diagnosis not present

## 2021-02-20 DIAGNOSIS — K219 Gastro-esophageal reflux disease without esophagitis: Secondary | ICD-10-CM | POA: Diagnosis not present

## 2021-02-20 DIAGNOSIS — I10 Essential (primary) hypertension: Secondary | ICD-10-CM | POA: Diagnosis not present

## 2021-02-20 DIAGNOSIS — M810 Age-related osteoporosis without current pathological fracture: Secondary | ICD-10-CM | POA: Diagnosis not present

## 2021-03-01 DIAGNOSIS — E559 Vitamin D deficiency, unspecified: Secondary | ICD-10-CM | POA: Diagnosis not present

## 2021-03-01 DIAGNOSIS — M810 Age-related osteoporosis without current pathological fracture: Secondary | ICD-10-CM | POA: Diagnosis not present

## 2021-03-01 DIAGNOSIS — I10 Essential (primary) hypertension: Secondary | ICD-10-CM | POA: Diagnosis not present

## 2021-03-14 DIAGNOSIS — I10 Essential (primary) hypertension: Secondary | ICD-10-CM | POA: Diagnosis not present

## 2021-03-21 DIAGNOSIS — M810 Age-related osteoporosis without current pathological fracture: Secondary | ICD-10-CM | POA: Diagnosis not present

## 2021-03-21 DIAGNOSIS — E559 Vitamin D deficiency, unspecified: Secondary | ICD-10-CM | POA: Diagnosis not present

## 2021-03-21 DIAGNOSIS — I10 Essential (primary) hypertension: Secondary | ICD-10-CM | POA: Diagnosis not present

## 2021-03-21 DIAGNOSIS — R200 Anesthesia of skin: Secondary | ICD-10-CM | POA: Diagnosis not present

## 2021-03-21 DIAGNOSIS — K219 Gastro-esophageal reflux disease without esophagitis: Secondary | ICD-10-CM | POA: Diagnosis not present

## 2021-03-25 DIAGNOSIS — E559 Vitamin D deficiency, unspecified: Secondary | ICD-10-CM | POA: Diagnosis not present

## 2021-03-25 DIAGNOSIS — E119 Type 2 diabetes mellitus without complications: Secondary | ICD-10-CM | POA: Diagnosis not present

## 2021-03-25 DIAGNOSIS — I10 Essential (primary) hypertension: Secondary | ICD-10-CM | POA: Diagnosis not present

## 2021-03-25 DIAGNOSIS — E039 Hypothyroidism, unspecified: Secondary | ICD-10-CM | POA: Diagnosis not present

## 2021-03-25 DIAGNOSIS — M810 Age-related osteoporosis without current pathological fracture: Secondary | ICD-10-CM | POA: Diagnosis not present

## 2021-04-04 DIAGNOSIS — D518 Other vitamin B12 deficiency anemias: Secondary | ICD-10-CM | POA: Diagnosis not present

## 2021-04-04 DIAGNOSIS — D519 Vitamin B12 deficiency anemia, unspecified: Secondary | ICD-10-CM | POA: Diagnosis not present

## 2021-04-04 DIAGNOSIS — Z79899 Other long term (current) drug therapy: Secondary | ICD-10-CM | POA: Diagnosis not present

## 2021-04-04 DIAGNOSIS — E7849 Other hyperlipidemia: Secondary | ICD-10-CM | POA: Diagnosis not present

## 2021-04-04 DIAGNOSIS — E039 Hypothyroidism, unspecified: Secondary | ICD-10-CM | POA: Diagnosis not present

## 2021-04-04 DIAGNOSIS — E559 Vitamin D deficiency, unspecified: Secondary | ICD-10-CM | POA: Diagnosis not present

## 2021-04-04 DIAGNOSIS — E119 Type 2 diabetes mellitus without complications: Secondary | ICD-10-CM | POA: Diagnosis not present

## 2021-04-16 DIAGNOSIS — I10 Essential (primary) hypertension: Secondary | ICD-10-CM | POA: Diagnosis not present

## 2021-04-18 DIAGNOSIS — E7800 Pure hypercholesterolemia, unspecified: Secondary | ICD-10-CM | POA: Diagnosis not present

## 2021-04-18 DIAGNOSIS — K219 Gastro-esophageal reflux disease without esophagitis: Secondary | ICD-10-CM | POA: Diagnosis not present

## 2021-04-18 DIAGNOSIS — E559 Vitamin D deficiency, unspecified: Secondary | ICD-10-CM | POA: Diagnosis not present

## 2021-04-18 DIAGNOSIS — M810 Age-related osteoporosis without current pathological fracture: Secondary | ICD-10-CM | POA: Diagnosis not present

## 2021-04-18 DIAGNOSIS — I10 Essential (primary) hypertension: Secondary | ICD-10-CM | POA: Diagnosis not present

## 2021-04-29 DIAGNOSIS — E119 Type 2 diabetes mellitus without complications: Secondary | ICD-10-CM | POA: Diagnosis not present

## 2021-04-29 DIAGNOSIS — E559 Vitamin D deficiency, unspecified: Secondary | ICD-10-CM | POA: Diagnosis not present

## 2021-04-29 DIAGNOSIS — M810 Age-related osteoporosis without current pathological fracture: Secondary | ICD-10-CM | POA: Diagnosis not present

## 2021-04-29 DIAGNOSIS — E7800 Pure hypercholesterolemia, unspecified: Secondary | ICD-10-CM | POA: Diagnosis not present

## 2021-04-29 DIAGNOSIS — I10 Essential (primary) hypertension: Secondary | ICD-10-CM | POA: Diagnosis not present

## 2021-04-29 DIAGNOSIS — E039 Hypothyroidism, unspecified: Secondary | ICD-10-CM | POA: Diagnosis not present

## 2021-05-06 DIAGNOSIS — R451 Restlessness and agitation: Secondary | ICD-10-CM | POA: Diagnosis not present

## 2021-05-06 DIAGNOSIS — F03918 Unspecified dementia, unspecified severity, with other behavioral disturbance: Secondary | ICD-10-CM | POA: Diagnosis not present

## 2021-05-15 DIAGNOSIS — I10 Essential (primary) hypertension: Secondary | ICD-10-CM | POA: Diagnosis not present

## 2021-05-23 DIAGNOSIS — K219 Gastro-esophageal reflux disease without esophagitis: Secondary | ICD-10-CM | POA: Diagnosis not present

## 2021-05-23 DIAGNOSIS — R202 Paresthesia of skin: Secondary | ICD-10-CM | POA: Diagnosis not present

## 2021-05-23 DIAGNOSIS — E7800 Pure hypercholesterolemia, unspecified: Secondary | ICD-10-CM | POA: Diagnosis not present

## 2021-05-23 DIAGNOSIS — I10 Essential (primary) hypertension: Secondary | ICD-10-CM | POA: Diagnosis not present

## 2021-05-23 DIAGNOSIS — M810 Age-related osteoporosis without current pathological fracture: Secondary | ICD-10-CM | POA: Diagnosis not present

## 2021-05-23 DIAGNOSIS — G5602 Carpal tunnel syndrome, left upper limb: Secondary | ICD-10-CM | POA: Diagnosis not present

## 2021-06-11 DIAGNOSIS — U071 COVID-19: Secondary | ICD-10-CM | POA: Diagnosis not present

## 2021-06-16 DIAGNOSIS — E039 Hypothyroidism, unspecified: Secondary | ICD-10-CM | POA: Diagnosis not present

## 2021-06-16 DIAGNOSIS — E559 Vitamin D deficiency, unspecified: Secondary | ICD-10-CM | POA: Diagnosis not present

## 2021-06-16 DIAGNOSIS — E7800 Pure hypercholesterolemia, unspecified: Secondary | ICD-10-CM | POA: Diagnosis not present

## 2021-06-16 DIAGNOSIS — I10 Essential (primary) hypertension: Secondary | ICD-10-CM | POA: Diagnosis not present

## 2021-06-16 DIAGNOSIS — M810 Age-related osteoporosis without current pathological fracture: Secondary | ICD-10-CM | POA: Diagnosis not present

## 2021-06-16 DIAGNOSIS — E119 Type 2 diabetes mellitus without complications: Secondary | ICD-10-CM | POA: Diagnosis not present

## 2021-06-18 DIAGNOSIS — I10 Essential (primary) hypertension: Secondary | ICD-10-CM | POA: Diagnosis not present

## 2021-06-20 DIAGNOSIS — G5602 Carpal tunnel syndrome, left upper limb: Secondary | ICD-10-CM | POA: Diagnosis not present

## 2021-06-20 DIAGNOSIS — K219 Gastro-esophageal reflux disease without esophagitis: Secondary | ICD-10-CM | POA: Diagnosis not present

## 2021-06-20 DIAGNOSIS — E559 Vitamin D deficiency, unspecified: Secondary | ICD-10-CM | POA: Diagnosis not present

## 2021-06-20 DIAGNOSIS — E7800 Pure hypercholesterolemia, unspecified: Secondary | ICD-10-CM | POA: Diagnosis not present

## 2021-06-20 DIAGNOSIS — M810 Age-related osteoporosis without current pathological fracture: Secondary | ICD-10-CM | POA: Diagnosis not present

## 2021-07-03 DIAGNOSIS — F03918 Unspecified dementia, unspecified severity, with other behavioral disturbance: Secondary | ICD-10-CM | POA: Diagnosis not present

## 2021-07-03 DIAGNOSIS — R451 Restlessness and agitation: Secondary | ICD-10-CM | POA: Diagnosis not present

## 2021-07-05 DIAGNOSIS — E038 Other specified hypothyroidism: Secondary | ICD-10-CM | POA: Diagnosis not present

## 2021-07-05 DIAGNOSIS — E119 Type 2 diabetes mellitus without complications: Secondary | ICD-10-CM | POA: Diagnosis not present

## 2021-07-05 DIAGNOSIS — E7800 Pure hypercholesterolemia, unspecified: Secondary | ICD-10-CM | POA: Diagnosis not present

## 2021-07-05 DIAGNOSIS — M810 Age-related osteoporosis without current pathological fracture: Secondary | ICD-10-CM | POA: Diagnosis not present

## 2021-07-05 DIAGNOSIS — I10 Essential (primary) hypertension: Secondary | ICD-10-CM | POA: Diagnosis not present

## 2021-07-05 DIAGNOSIS — E559 Vitamin D deficiency, unspecified: Secondary | ICD-10-CM | POA: Diagnosis not present

## 2021-07-05 DIAGNOSIS — E039 Hypothyroidism, unspecified: Secondary | ICD-10-CM | POA: Diagnosis not present

## 2021-07-05 DIAGNOSIS — D518 Other vitamin B12 deficiency anemias: Secondary | ICD-10-CM | POA: Diagnosis not present

## 2021-07-16 DIAGNOSIS — E119 Type 2 diabetes mellitus without complications: Secondary | ICD-10-CM | POA: Diagnosis not present

## 2021-07-16 DIAGNOSIS — I10 Essential (primary) hypertension: Secondary | ICD-10-CM | POA: Diagnosis not present

## 2021-07-16 DIAGNOSIS — E559 Vitamin D deficiency, unspecified: Secondary | ICD-10-CM | POA: Diagnosis not present

## 2021-07-16 DIAGNOSIS — Z79899 Other long term (current) drug therapy: Secondary | ICD-10-CM | POA: Diagnosis not present

## 2021-07-16 DIAGNOSIS — D518 Other vitamin B12 deficiency anemias: Secondary | ICD-10-CM | POA: Diagnosis not present

## 2021-07-16 DIAGNOSIS — E7849 Other hyperlipidemia: Secondary | ICD-10-CM | POA: Diagnosis not present

## 2021-07-18 DIAGNOSIS — R202 Paresthesia of skin: Secondary | ICD-10-CM | POA: Diagnosis not present

## 2021-07-18 DIAGNOSIS — M810 Age-related osteoporosis without current pathological fracture: Secondary | ICD-10-CM | POA: Diagnosis not present

## 2021-07-18 DIAGNOSIS — E7800 Pure hypercholesterolemia, unspecified: Secondary | ICD-10-CM | POA: Diagnosis not present

## 2021-07-18 DIAGNOSIS — K219 Gastro-esophageal reflux disease without esophagitis: Secondary | ICD-10-CM | POA: Diagnosis not present

## 2021-07-18 DIAGNOSIS — I10 Essential (primary) hypertension: Secondary | ICD-10-CM | POA: Diagnosis not present

## 2021-07-18 DIAGNOSIS — E559 Vitamin D deficiency, unspecified: Secondary | ICD-10-CM | POA: Diagnosis not present

## 2021-07-19 DIAGNOSIS — R001 Bradycardia, unspecified: Secondary | ICD-10-CM | POA: Diagnosis not present

## 2021-07-19 DIAGNOSIS — W19XXXA Unspecified fall, initial encounter: Secondary | ICD-10-CM | POA: Diagnosis not present

## 2021-07-19 DIAGNOSIS — I517 Cardiomegaly: Secondary | ICD-10-CM | POA: Diagnosis not present

## 2021-07-19 DIAGNOSIS — Z7401 Bed confinement status: Secondary | ICD-10-CM | POA: Diagnosis not present

## 2021-07-19 DIAGNOSIS — R531 Weakness: Secondary | ICD-10-CM | POA: Diagnosis not present

## 2021-07-19 DIAGNOSIS — R5383 Other fatigue: Secondary | ICD-10-CM | POA: Diagnosis not present

## 2021-07-19 DIAGNOSIS — Z743 Need for continuous supervision: Secondary | ICD-10-CM | POA: Diagnosis not present

## 2021-07-19 DIAGNOSIS — M25561 Pain in right knee: Secondary | ICD-10-CM | POA: Diagnosis not present

## 2021-07-19 DIAGNOSIS — R404 Transient alteration of awareness: Secondary | ICD-10-CM | POA: Diagnosis not present

## 2021-07-19 DIAGNOSIS — R4020 Unspecified coma: Secondary | ICD-10-CM | POA: Diagnosis not present

## 2021-07-19 DIAGNOSIS — D649 Anemia, unspecified: Secondary | ICD-10-CM | POA: Diagnosis not present

## 2021-07-19 DIAGNOSIS — R930 Abnormal findings on diagnostic imaging of skull and head, not elsewhere classified: Secondary | ICD-10-CM | POA: Diagnosis not present

## 2021-07-29 DIAGNOSIS — M810 Age-related osteoporosis without current pathological fracture: Secondary | ICD-10-CM | POA: Diagnosis not present

## 2021-07-29 DIAGNOSIS — I10 Essential (primary) hypertension: Secondary | ICD-10-CM | POA: Diagnosis not present

## 2021-07-29 DIAGNOSIS — E038 Other specified hypothyroidism: Secondary | ICD-10-CM | POA: Diagnosis not present

## 2021-07-29 DIAGNOSIS — E119 Type 2 diabetes mellitus without complications: Secondary | ICD-10-CM | POA: Diagnosis not present

## 2021-07-29 DIAGNOSIS — E039 Hypothyroidism, unspecified: Secondary | ICD-10-CM | POA: Diagnosis not present

## 2021-07-29 DIAGNOSIS — D518 Other vitamin B12 deficiency anemias: Secondary | ICD-10-CM | POA: Diagnosis not present

## 2021-07-29 DIAGNOSIS — E559 Vitamin D deficiency, unspecified: Secondary | ICD-10-CM | POA: Diagnosis not present

## 2021-07-29 DIAGNOSIS — E7800 Pure hypercholesterolemia, unspecified: Secondary | ICD-10-CM | POA: Diagnosis not present

## 2021-07-31 DIAGNOSIS — Z79899 Other long term (current) drug therapy: Secondary | ICD-10-CM | POA: Diagnosis not present

## 2021-07-31 DIAGNOSIS — R451 Restlessness and agitation: Secondary | ICD-10-CM | POA: Diagnosis not present

## 2021-07-31 DIAGNOSIS — F03918 Unspecified dementia, unspecified severity, with other behavioral disturbance: Secondary | ICD-10-CM | POA: Diagnosis not present

## 2021-08-01 DIAGNOSIS — Z79899 Other long term (current) drug therapy: Secondary | ICD-10-CM | POA: Diagnosis not present

## 2021-08-14 DIAGNOSIS — I10 Essential (primary) hypertension: Secondary | ICD-10-CM | POA: Diagnosis not present

## 2021-08-15 DIAGNOSIS — G5602 Carpal tunnel syndrome, left upper limb: Secondary | ICD-10-CM | POA: Diagnosis not present

## 2021-08-15 DIAGNOSIS — R202 Paresthesia of skin: Secondary | ICD-10-CM | POA: Diagnosis not present

## 2021-08-15 DIAGNOSIS — K219 Gastro-esophageal reflux disease without esophagitis: Secondary | ICD-10-CM | POA: Diagnosis not present

## 2021-08-15 DIAGNOSIS — I10 Essential (primary) hypertension: Secondary | ICD-10-CM | POA: Diagnosis not present

## 2021-08-15 DIAGNOSIS — E559 Vitamin D deficiency, unspecified: Secondary | ICD-10-CM | POA: Diagnosis not present

## 2021-08-15 DIAGNOSIS — E7800 Pure hypercholesterolemia, unspecified: Secondary | ICD-10-CM | POA: Diagnosis not present

## 2021-08-28 DIAGNOSIS — Z79899 Other long term (current) drug therapy: Secondary | ICD-10-CM | POA: Diagnosis not present

## 2021-08-28 DIAGNOSIS — F03918 Unspecified dementia, unspecified severity, with other behavioral disturbance: Secondary | ICD-10-CM | POA: Diagnosis not present

## 2021-08-28 DIAGNOSIS — R451 Restlessness and agitation: Secondary | ICD-10-CM | POA: Diagnosis not present

## 2021-08-29 DIAGNOSIS — R55 Syncope and collapse: Secondary | ICD-10-CM | POA: Diagnosis not present

## 2021-08-29 DIAGNOSIS — Z743 Need for continuous supervision: Secondary | ICD-10-CM | POA: Diagnosis not present

## 2021-08-29 DIAGNOSIS — R404 Transient alteration of awareness: Secondary | ICD-10-CM | POA: Diagnosis not present

## 2021-08-29 DIAGNOSIS — I6782 Cerebral ischemia: Secondary | ICD-10-CM | POA: Diagnosis not present

## 2021-08-29 DIAGNOSIS — R0902 Hypoxemia: Secondary | ICD-10-CM | POA: Diagnosis not present

## 2021-08-29 DIAGNOSIS — R4020 Unspecified coma: Secondary | ICD-10-CM | POA: Diagnosis not present

## 2021-08-30 DIAGNOSIS — Z743 Need for continuous supervision: Secondary | ICD-10-CM | POA: Diagnosis not present

## 2021-08-30 DIAGNOSIS — R531 Weakness: Secondary | ICD-10-CM | POA: Diagnosis not present

## 2021-08-31 DIAGNOSIS — D518 Other vitamin B12 deficiency anemias: Secondary | ICD-10-CM | POA: Diagnosis not present

## 2021-08-31 DIAGNOSIS — E7800 Pure hypercholesterolemia, unspecified: Secondary | ICD-10-CM | POA: Diagnosis not present

## 2021-08-31 DIAGNOSIS — E039 Hypothyroidism, unspecified: Secondary | ICD-10-CM | POA: Diagnosis not present

## 2021-08-31 DIAGNOSIS — I10 Essential (primary) hypertension: Secondary | ICD-10-CM | POA: Diagnosis not present

## 2021-08-31 DIAGNOSIS — E038 Other specified hypothyroidism: Secondary | ICD-10-CM | POA: Diagnosis not present

## 2021-08-31 DIAGNOSIS — M810 Age-related osteoporosis without current pathological fracture: Secondary | ICD-10-CM | POA: Diagnosis not present

## 2021-08-31 DIAGNOSIS — E119 Type 2 diabetes mellitus without complications: Secondary | ICD-10-CM | POA: Diagnosis not present

## 2021-09-05 DIAGNOSIS — N390 Urinary tract infection, site not specified: Secondary | ICD-10-CM | POA: Diagnosis not present

## 2021-09-11 DIAGNOSIS — R451 Restlessness and agitation: Secondary | ICD-10-CM | POA: Diagnosis not present

## 2021-09-11 DIAGNOSIS — Z79899 Other long term (current) drug therapy: Secondary | ICD-10-CM | POA: Diagnosis not present

## 2021-09-11 DIAGNOSIS — F03918 Unspecified dementia, unspecified severity, with other behavioral disturbance: Secondary | ICD-10-CM | POA: Diagnosis not present

## 2021-09-14 DIAGNOSIS — I10 Essential (primary) hypertension: Secondary | ICD-10-CM | POA: Diagnosis not present

## 2021-09-30 DIAGNOSIS — E559 Vitamin D deficiency, unspecified: Secondary | ICD-10-CM | POA: Diagnosis not present

## 2021-09-30 DIAGNOSIS — E7800 Pure hypercholesterolemia, unspecified: Secondary | ICD-10-CM | POA: Diagnosis not present

## 2021-09-30 DIAGNOSIS — I10 Essential (primary) hypertension: Secondary | ICD-10-CM | POA: Diagnosis not present

## 2021-09-30 DIAGNOSIS — M810 Age-related osteoporosis without current pathological fracture: Secondary | ICD-10-CM | POA: Diagnosis not present

## 2021-09-30 DIAGNOSIS — K219 Gastro-esophageal reflux disease without esophagitis: Secondary | ICD-10-CM | POA: Diagnosis not present

## 2021-09-30 DIAGNOSIS — R202 Paresthesia of skin: Secondary | ICD-10-CM | POA: Diagnosis not present

## 2021-10-09 DIAGNOSIS — R451 Restlessness and agitation: Secondary | ICD-10-CM | POA: Diagnosis not present

## 2021-10-09 DIAGNOSIS — F03918 Unspecified dementia, unspecified severity, with other behavioral disturbance: Secondary | ICD-10-CM | POA: Diagnosis not present

## 2021-10-09 DIAGNOSIS — Z79899 Other long term (current) drug therapy: Secondary | ICD-10-CM | POA: Diagnosis not present

## 2021-10-14 DIAGNOSIS — M810 Age-related osteoporosis without current pathological fracture: Secondary | ICD-10-CM | POA: Diagnosis not present

## 2021-10-14 DIAGNOSIS — E559 Vitamin D deficiency, unspecified: Secondary | ICD-10-CM | POA: Diagnosis not present

## 2021-10-14 DIAGNOSIS — D518 Other vitamin B12 deficiency anemias: Secondary | ICD-10-CM | POA: Diagnosis not present

## 2021-10-14 DIAGNOSIS — I10 Essential (primary) hypertension: Secondary | ICD-10-CM | POA: Diagnosis not present

## 2021-10-14 DIAGNOSIS — E038 Other specified hypothyroidism: Secondary | ICD-10-CM | POA: Diagnosis not present

## 2021-10-14 DIAGNOSIS — E7800 Pure hypercholesterolemia, unspecified: Secondary | ICD-10-CM | POA: Diagnosis not present

## 2021-10-14 DIAGNOSIS — E119 Type 2 diabetes mellitus without complications: Secondary | ICD-10-CM | POA: Diagnosis not present

## 2021-10-14 DIAGNOSIS — E039 Hypothyroidism, unspecified: Secondary | ICD-10-CM | POA: Diagnosis not present

## 2021-10-15 DIAGNOSIS — I10 Essential (primary) hypertension: Secondary | ICD-10-CM | POA: Diagnosis not present

## 2021-10-17 DIAGNOSIS — E038 Other specified hypothyroidism: Secondary | ICD-10-CM | POA: Diagnosis not present

## 2021-10-17 DIAGNOSIS — E119 Type 2 diabetes mellitus without complications: Secondary | ICD-10-CM | POA: Diagnosis not present

## 2021-10-17 DIAGNOSIS — D518 Other vitamin B12 deficiency anemias: Secondary | ICD-10-CM | POA: Diagnosis not present

## 2021-10-17 DIAGNOSIS — E7849 Other hyperlipidemia: Secondary | ICD-10-CM | POA: Diagnosis not present

## 2021-10-17 DIAGNOSIS — E559 Vitamin D deficiency, unspecified: Secondary | ICD-10-CM | POA: Diagnosis not present

## 2021-10-17 DIAGNOSIS — Z79899 Other long term (current) drug therapy: Secondary | ICD-10-CM | POA: Diagnosis not present

## 2021-10-26 DIAGNOSIS — K219 Gastro-esophageal reflux disease without esophagitis: Secondary | ICD-10-CM | POA: Diagnosis not present

## 2021-10-26 DIAGNOSIS — Z0000 Encounter for general adult medical examination without abnormal findings: Secondary | ICD-10-CM | POA: Diagnosis not present

## 2021-10-26 DIAGNOSIS — E7800 Pure hypercholesterolemia, unspecified: Secondary | ICD-10-CM | POA: Diagnosis not present

## 2021-10-26 DIAGNOSIS — I10 Essential (primary) hypertension: Secondary | ICD-10-CM | POA: Diagnosis not present

## 2021-10-26 DIAGNOSIS — E559 Vitamin D deficiency, unspecified: Secondary | ICD-10-CM | POA: Diagnosis not present

## 2021-10-26 DIAGNOSIS — M810 Age-related osteoporosis without current pathological fracture: Secondary | ICD-10-CM | POA: Diagnosis not present

## 2021-10-26 DIAGNOSIS — R202 Paresthesia of skin: Secondary | ICD-10-CM | POA: Diagnosis not present

## 2021-10-28 DIAGNOSIS — K219 Gastro-esophageal reflux disease without esophagitis: Secondary | ICD-10-CM | POA: Diagnosis not present

## 2021-10-28 DIAGNOSIS — G5602 Carpal tunnel syndrome, left upper limb: Secondary | ICD-10-CM | POA: Diagnosis not present

## 2021-10-28 DIAGNOSIS — E7800 Pure hypercholesterolemia, unspecified: Secondary | ICD-10-CM | POA: Diagnosis not present

## 2021-10-28 DIAGNOSIS — E559 Vitamin D deficiency, unspecified: Secondary | ICD-10-CM | POA: Diagnosis not present

## 2021-10-28 DIAGNOSIS — R202 Paresthesia of skin: Secondary | ICD-10-CM | POA: Diagnosis not present

## 2021-10-28 DIAGNOSIS — M810 Age-related osteoporosis without current pathological fracture: Secondary | ICD-10-CM | POA: Diagnosis not present

## 2021-11-14 DIAGNOSIS — I10 Essential (primary) hypertension: Secondary | ICD-10-CM | POA: Diagnosis not present

## 2021-11-14 DIAGNOSIS — E119 Type 2 diabetes mellitus without complications: Secondary | ICD-10-CM | POA: Diagnosis not present

## 2021-11-14 DIAGNOSIS — F03918 Unspecified dementia, unspecified severity, with other behavioral disturbance: Secondary | ICD-10-CM | POA: Diagnosis not present

## 2021-11-14 DIAGNOSIS — D518 Other vitamin B12 deficiency anemias: Secondary | ICD-10-CM | POA: Diagnosis not present

## 2021-11-14 DIAGNOSIS — E559 Vitamin D deficiency, unspecified: Secondary | ICD-10-CM | POA: Diagnosis not present

## 2021-11-14 DIAGNOSIS — E038 Other specified hypothyroidism: Secondary | ICD-10-CM | POA: Diagnosis not present

## 2021-11-14 DIAGNOSIS — M810 Age-related osteoporosis without current pathological fracture: Secondary | ICD-10-CM | POA: Diagnosis not present

## 2021-11-14 DIAGNOSIS — Z79899 Other long term (current) drug therapy: Secondary | ICD-10-CM | POA: Diagnosis not present

## 2021-11-14 DIAGNOSIS — R451 Restlessness and agitation: Secondary | ICD-10-CM | POA: Diagnosis not present

## 2021-11-15 DIAGNOSIS — I10 Essential (primary) hypertension: Secondary | ICD-10-CM | POA: Diagnosis not present

## 2021-11-25 DIAGNOSIS — K219 Gastro-esophageal reflux disease without esophagitis: Secondary | ICD-10-CM | POA: Diagnosis not present

## 2021-11-25 DIAGNOSIS — M810 Age-related osteoporosis without current pathological fracture: Secondary | ICD-10-CM | POA: Diagnosis not present

## 2021-11-25 DIAGNOSIS — E7800 Pure hypercholesterolemia, unspecified: Secondary | ICD-10-CM | POA: Diagnosis not present

## 2021-11-25 DIAGNOSIS — I10 Essential (primary) hypertension: Secondary | ICD-10-CM | POA: Diagnosis not present

## 2021-11-25 DIAGNOSIS — E559 Vitamin D deficiency, unspecified: Secondary | ICD-10-CM | POA: Diagnosis not present

## 2021-11-27 DIAGNOSIS — F03918 Unspecified dementia, unspecified severity, with other behavioral disturbance: Secondary | ICD-10-CM | POA: Diagnosis not present

## 2021-11-27 DIAGNOSIS — Z79899 Other long term (current) drug therapy: Secondary | ICD-10-CM | POA: Diagnosis not present

## 2021-11-27 DIAGNOSIS — R451 Restlessness and agitation: Secondary | ICD-10-CM | POA: Diagnosis not present

## 2021-11-28 DIAGNOSIS — I10 Essential (primary) hypertension: Secondary | ICD-10-CM | POA: Diagnosis not present

## 2021-11-28 DIAGNOSIS — M810 Age-related osteoporosis without current pathological fracture: Secondary | ICD-10-CM | POA: Diagnosis not present

## 2021-11-28 DIAGNOSIS — E119 Type 2 diabetes mellitus without complications: Secondary | ICD-10-CM | POA: Diagnosis not present

## 2021-12-03 DIAGNOSIS — R011 Cardiac murmur, unspecified: Secondary | ICD-10-CM | POA: Diagnosis not present

## 2021-12-10 DIAGNOSIS — R0989 Other specified symptoms and signs involving the circulatory and respiratory systems: Secondary | ICD-10-CM | POA: Diagnosis not present

## 2021-12-10 DIAGNOSIS — I6523 Occlusion and stenosis of bilateral carotid arteries: Secondary | ICD-10-CM | POA: Diagnosis not present

## 2021-12-15 DIAGNOSIS — I10 Essential (primary) hypertension: Secondary | ICD-10-CM | POA: Diagnosis not present

## 2021-12-19 DIAGNOSIS — I70223 Atherosclerosis of native arteries of extremities with rest pain, bilateral legs: Secondary | ICD-10-CM | POA: Diagnosis not present

## 2021-12-25 DIAGNOSIS — F03918 Unspecified dementia, unspecified severity, with other behavioral disturbance: Secondary | ICD-10-CM | POA: Diagnosis not present

## 2021-12-25 DIAGNOSIS — R451 Restlessness and agitation: Secondary | ICD-10-CM | POA: Diagnosis not present

## 2021-12-25 DIAGNOSIS — Z79899 Other long term (current) drug therapy: Secondary | ICD-10-CM | POA: Diagnosis not present

## 2021-12-26 DIAGNOSIS — E559 Vitamin D deficiency, unspecified: Secondary | ICD-10-CM | POA: Diagnosis not present

## 2021-12-26 DIAGNOSIS — I77811 Abdominal aortic ectasia: Secondary | ICD-10-CM | POA: Diagnosis not present

## 2021-12-26 DIAGNOSIS — M810 Age-related osteoporosis without current pathological fracture: Secondary | ICD-10-CM | POA: Diagnosis not present

## 2021-12-26 DIAGNOSIS — K219 Gastro-esophageal reflux disease without esophagitis: Secondary | ICD-10-CM | POA: Diagnosis not present

## 2021-12-26 DIAGNOSIS — E7800 Pure hypercholesterolemia, unspecified: Secondary | ICD-10-CM | POA: Diagnosis not present

## 2021-12-27 DIAGNOSIS — M810 Age-related osteoporosis without current pathological fracture: Secondary | ICD-10-CM | POA: Diagnosis not present

## 2021-12-27 DIAGNOSIS — I10 Essential (primary) hypertension: Secondary | ICD-10-CM | POA: Diagnosis not present

## 2021-12-27 DIAGNOSIS — D518 Other vitamin B12 deficiency anemias: Secondary | ICD-10-CM | POA: Diagnosis not present

## 2021-12-27 DIAGNOSIS — E559 Vitamin D deficiency, unspecified: Secondary | ICD-10-CM | POA: Diagnosis not present

## 2021-12-27 DIAGNOSIS — E038 Other specified hypothyroidism: Secondary | ICD-10-CM | POA: Diagnosis not present

## 2021-12-27 DIAGNOSIS — E119 Type 2 diabetes mellitus without complications: Secondary | ICD-10-CM | POA: Diagnosis not present

## 2022-01-09 DIAGNOSIS — U071 COVID-19: Secondary | ICD-10-CM | POA: Diagnosis not present

## 2022-01-15 DIAGNOSIS — I10 Essential (primary) hypertension: Secondary | ICD-10-CM | POA: Diagnosis not present

## 2022-01-16 DIAGNOSIS — U071 COVID-19: Secondary | ICD-10-CM | POA: Diagnosis not present

## 2022-01-22 DIAGNOSIS — R451 Restlessness and agitation: Secondary | ICD-10-CM | POA: Diagnosis not present

## 2022-01-22 DIAGNOSIS — Z79899 Other long term (current) drug therapy: Secondary | ICD-10-CM | POA: Diagnosis not present

## 2022-01-22 DIAGNOSIS — F02C11 Dementia in other diseases classified elsewhere, severe, with agitation: Secondary | ICD-10-CM | POA: Diagnosis not present

## 2022-01-22 DIAGNOSIS — F03C11 Unspecified dementia, severe, with agitation: Secondary | ICD-10-CM | POA: Diagnosis not present

## 2022-01-23 DIAGNOSIS — E7800 Pure hypercholesterolemia, unspecified: Secondary | ICD-10-CM | POA: Diagnosis not present

## 2022-01-23 DIAGNOSIS — M810 Age-related osteoporosis without current pathological fracture: Secondary | ICD-10-CM | POA: Diagnosis not present

## 2022-01-23 DIAGNOSIS — E559 Vitamin D deficiency, unspecified: Secondary | ICD-10-CM | POA: Diagnosis not present

## 2022-01-23 DIAGNOSIS — U071 COVID-19: Secondary | ICD-10-CM | POA: Diagnosis not present

## 2022-01-23 DIAGNOSIS — K219 Gastro-esophageal reflux disease without esophagitis: Secondary | ICD-10-CM | POA: Diagnosis not present

## 2022-01-23 DIAGNOSIS — I10 Essential (primary) hypertension: Secondary | ICD-10-CM | POA: Diagnosis not present

## 2022-01-25 DIAGNOSIS — E119 Type 2 diabetes mellitus without complications: Secondary | ICD-10-CM | POA: Diagnosis not present

## 2022-01-25 DIAGNOSIS — E559 Vitamin D deficiency, unspecified: Secondary | ICD-10-CM | POA: Diagnosis not present

## 2022-01-25 DIAGNOSIS — D518 Other vitamin B12 deficiency anemias: Secondary | ICD-10-CM | POA: Diagnosis not present

## 2022-01-25 DIAGNOSIS — I10 Essential (primary) hypertension: Secondary | ICD-10-CM | POA: Diagnosis not present

## 2022-01-25 DIAGNOSIS — E038 Other specified hypothyroidism: Secondary | ICD-10-CM | POA: Diagnosis not present

## 2022-01-25 DIAGNOSIS — M810 Age-related osteoporosis without current pathological fracture: Secondary | ICD-10-CM | POA: Diagnosis not present

## 2022-01-25 DIAGNOSIS — E782 Mixed hyperlipidemia: Secondary | ICD-10-CM | POA: Diagnosis not present

## 2022-02-06 DIAGNOSIS — E119 Type 2 diabetes mellitus without complications: Secondary | ICD-10-CM | POA: Diagnosis not present

## 2022-02-06 DIAGNOSIS — D518 Other vitamin B12 deficiency anemias: Secondary | ICD-10-CM | POA: Diagnosis not present

## 2022-02-06 DIAGNOSIS — E782 Mixed hyperlipidemia: Secondary | ICD-10-CM | POA: Diagnosis not present

## 2022-02-06 DIAGNOSIS — E559 Vitamin D deficiency, unspecified: Secondary | ICD-10-CM | POA: Diagnosis not present

## 2022-02-06 DIAGNOSIS — E038 Other specified hypothyroidism: Secondary | ICD-10-CM | POA: Diagnosis not present

## 2022-02-06 DIAGNOSIS — Z79899 Other long term (current) drug therapy: Secondary | ICD-10-CM | POA: Diagnosis not present

## 2022-02-19 DIAGNOSIS — F02C18 Dementia in other diseases classified elsewhere, severe, with other behavioral disturbance: Secondary | ICD-10-CM | POA: Diagnosis not present

## 2022-02-19 DIAGNOSIS — Z79899 Other long term (current) drug therapy: Secondary | ICD-10-CM | POA: Diagnosis not present

## 2022-02-19 DIAGNOSIS — R451 Restlessness and agitation: Secondary | ICD-10-CM | POA: Diagnosis not present

## 2022-02-19 DIAGNOSIS — F02C3 Dementia in other diseases classified elsewhere, severe, with mood disturbance: Secondary | ICD-10-CM | POA: Diagnosis not present

## 2022-02-19 DIAGNOSIS — F02C4 Dementia in other diseases classified elsewhere, severe, with anxiety: Secondary | ICD-10-CM | POA: Diagnosis not present

## 2022-02-25 DIAGNOSIS — E038 Other specified hypothyroidism: Secondary | ICD-10-CM | POA: Diagnosis not present

## 2022-02-25 DIAGNOSIS — M810 Age-related osteoporosis without current pathological fracture: Secondary | ICD-10-CM | POA: Diagnosis not present

## 2022-02-25 DIAGNOSIS — E559 Vitamin D deficiency, unspecified: Secondary | ICD-10-CM | POA: Diagnosis not present

## 2022-02-25 DIAGNOSIS — E119 Type 2 diabetes mellitus without complications: Secondary | ICD-10-CM | POA: Diagnosis not present

## 2022-02-25 DIAGNOSIS — I10 Essential (primary) hypertension: Secondary | ICD-10-CM | POA: Diagnosis not present

## 2022-02-25 DIAGNOSIS — E782 Mixed hyperlipidemia: Secondary | ICD-10-CM | POA: Diagnosis not present

## 2022-02-25 DIAGNOSIS — D518 Other vitamin B12 deficiency anemias: Secondary | ICD-10-CM | POA: Diagnosis not present

## 2022-02-27 DIAGNOSIS — M810 Age-related osteoporosis without current pathological fracture: Secondary | ICD-10-CM | POA: Diagnosis not present

## 2022-02-27 DIAGNOSIS — I10 Essential (primary) hypertension: Secondary | ICD-10-CM | POA: Diagnosis not present

## 2022-02-27 DIAGNOSIS — E7800 Pure hypercholesterolemia, unspecified: Secondary | ICD-10-CM | POA: Diagnosis not present

## 2022-02-27 DIAGNOSIS — K219 Gastro-esophageal reflux disease without esophagitis: Secondary | ICD-10-CM | POA: Diagnosis not present

## 2022-02-27 DIAGNOSIS — E559 Vitamin D deficiency, unspecified: Secondary | ICD-10-CM | POA: Diagnosis not present

## 2022-02-28 ENCOUNTER — Emergency Department (HOSPITAL_COMMUNITY)
Admission: EM | Admit: 2022-02-28 | Discharge: 2022-02-28 | Disposition: A | Discharge status: Home / Self Care | Payer: Medicare Other | Attending: Emergency Medicine | Admitting: Emergency Medicine

## 2022-02-28 ENCOUNTER — Other Ambulatory Visit: Payer: Self-pay

## 2022-02-28 ENCOUNTER — Encounter (HOSPITAL_COMMUNITY): Payer: Self-pay | Admitting: Emergency Medicine

## 2022-02-28 DIAGNOSIS — I10 Essential (primary) hypertension: Secondary | ICD-10-CM | POA: Diagnosis not present

## 2022-02-28 DIAGNOSIS — F0390 Unspecified dementia without behavioral disturbance: Secondary | ICD-10-CM

## 2022-02-28 DIAGNOSIS — R404 Transient alteration of awareness: Secondary | ICD-10-CM | POA: Diagnosis not present

## 2022-02-28 DIAGNOSIS — Z79899 Other long term (current) drug therapy: Secondary | ICD-10-CM | POA: Insufficient documentation

## 2022-02-28 DIAGNOSIS — Z743 Need for continuous supervision: Secondary | ICD-10-CM | POA: Diagnosis not present

## 2022-02-28 DIAGNOSIS — Z7401 Bed confinement status: Secondary | ICD-10-CM | POA: Diagnosis not present

## 2022-02-28 DIAGNOSIS — R202 Paresthesia of skin: Secondary | ICD-10-CM | POA: Diagnosis present

## 2022-02-28 LAB — CBC
HCT: 27.1 % — ABNORMAL LOW (ref 36.0–46.0)
Hemoglobin: 8.6 g/dL — ABNORMAL LOW (ref 12.0–15.0)
MCH: 29.1 pg (ref 26.0–34.0)
MCHC: 31.7 g/dL (ref 30.0–36.0)
MCV: 91.6 fL (ref 80.0–100.0)
Platelets: 140 10*3/uL — ABNORMAL LOW (ref 150–400)
RBC: 2.96 MIL/uL — ABNORMAL LOW (ref 3.87–5.11)
RDW: 14.3 % (ref 11.5–15.5)
WBC: 4.2 10*3/uL (ref 4.0–10.5)
nRBC: 0 % (ref 0.0–0.2)

## 2022-02-28 LAB — BASIC METABOLIC PANEL
Anion gap: 7 (ref 5–15)
BUN: 17 mg/dL (ref 8–23)
CO2: 29 mmol/L (ref 22–32)
Calcium: 9.1 mg/dL (ref 8.9–10.3)
Chloride: 100 mmol/L (ref 98–111)
Creatinine, Ser: 0.89 mg/dL (ref 0.44–1.00)
GFR, Estimated: >60 mL/min (ref >60)
Glucose, Bld: 100 mg/dL — ABNORMAL HIGH (ref 70–99)
Potassium: 3.6 mmol/L (ref 3.5–5.1)
Sodium: 136 mmol/L (ref 135–145)

## 2022-02-28 LAB — URINALYSIS, ROUTINE W REFLEX MICROSCOPIC
Bilirubin Urine: NEGATIVE
Glucose, UA: NEGATIVE mg/dL
Hgb urine dipstick: NEGATIVE
Ketones, ur: NEGATIVE mg/dL
Leukocytes,Ua: NEGATIVE
Nitrite: NEGATIVE
Protein, ur: NEGATIVE mg/dL
Specific Gravity, Urine: 1.013 (ref 1.005–1.030)
pH: 5 (ref 5.0–8.0)

## 2022-02-28 LAB — CBG MONITORING, ED: Glucose-Capillary: 86 mg/dL (ref 70–99)

## 2022-02-28 NOTE — ED Provider Notes (Addendum)
[facility] EMERGENCY DEPARTMENT Provider Note   CSN: [id number] Arrival date & time: 02/28/22  1649     History  No chief complaint on file.   Gloria Navarro is a 81 y.o. female.  Patient brought in by EMS from [facility] home patient is a memory care patient.  Patient baseline describe gets up on her own performs most activities but confused dementia at baseline this morning she did not get up for breakfast.  They did not notice any focal deficits seem less engaged little more spacey patient here follows commands very well arm strength leg strength very good no complaints of chest pain abdominal pain headache any dysuria no nausea vomiting or diarrhea.  Past medical history significant for essential hypertension gastroesophageal reflux disease osteoporosis and left hand paresthesia but patient uses both hands very well moves fingers very well.       Home Medications Prior to Admission medications   Medication Sig Start Date End Date Taking? Authorizing Provider  lisinopril-hydrochlorothiazide (PRINZIDE,ZESTORETIC) 10-12.5 MG tablet TAKE ONE TABLET BY MOUTH ONCE DAILY Patient not taking: Reported on 02/19/2020 07/18/15   Harrison Mons, PA  carvedilol (COREG) 3.125 MG tablet Take 3.125 mg by mouth 2 (two) times daily with a meal.    08/04/11  [provider]      Allergies    Patient has no known allergies.    Review of Systems   Review of Systems  Unable to perform ROS: Dementia    Physical Exam Updated Vital Signs BP (!) 159/83   Pulse 69   Temp 97.8 F (36.6 C) (Oral)   Resp (!) 23   SpO2 90%  Physical Exam Vitals and nursing note reviewed.  Constitutional:      General: She is not in acute distress.    Appearance: Normal appearance. She is well-developed.  HENT:     Head: Normocephalic and atraumatic.     Mouth/Throat:     Mouth: Mucous membranes are moist.  Eyes:     Conjunctiva/sclera: Conjunctivae normal.      Pupils: Pupils are equal, round, and reactive to light.  Cardiovascular:     Rate and Rhythm: Normal rate and regular rhythm.     Heart sounds: No murmur heard. Pulmonary:     Effort: Pulmonary effort is normal. No respiratory distress.     Breath sounds: Normal breath sounds. No wheezing or rales.  Abdominal:     General: There is no distension.     Palpations: Abdomen is soft.     Tenderness: There is no abdominal tenderness. There is no guarding.  Musculoskeletal:        General: No swelling.     Cervical back: Normal range of motion and neck supple.     Right lower leg: No edema.     Left lower leg: No edema.  Skin:    General: Skin is warm and dry.     Capillary Refill: Capillary refill takes less than 2 seconds.  Neurological:     General: No focal deficit present.     Mental Status: She is alert. Mental status is at baseline.     Cranial Nerves: No cranial nerve deficit.     Motor: No weakness.  Psychiatric:        Mood and Affect: Mood normal.     ED Results / Procedures / Treatments   Labs (all labs ordered are listed, but only abnormal results are displayed) Labs Reviewed  CBC -  Abnormal; Notable for the following components:      Result Value   RBC 2.96 (*)    Hemoglobin 8.6 (*)    HCT 27.1 (*)    Platelets 140 (*)    All other components within normal limits  URINALYSIS, ROUTINE W REFLEX MICROSCOPIC  COMPREHENSIVE METABOLIC PANEL  CBG MONITORING, ED  CBG MONITORING, ED    EKG EKG Interpretation  Date/Time:  Friday February 28 2022 16:59:10 EDT Ventricular Rate:  60 PR Interval:  200 QRS Duration: 109 QT Interval:  455 QTC Calculation: 455 R Axis:   -24 Text Interpretation: Sinus rhythm Ventricular premature complex Abnormal R-wave progression, late transition Left ventricular hypertrophy Anterior ST elevation, probably due to LVH Confirmed by Vanetta Mulders [phone]) on 02/28/2022 5:22:56 PM  Radiology No results found.  Procedures Procedures     Medications Ordered in ED Medications - No data to display  ED Course/ Medical Decision Making/ A&P                           Medical Decision Making Amount and/or Complexity of Data Reviewed Labs: ordered.   Patient here without any complaints.  Moving all 4 extremities.  We will do basic lab work-up and check urinalysis.  Patient's vital signs here are also very reassuring temp 97.8 heart rate 89 blood pressure 155/89 oxygen sats on room air 100%.  CBC no leukocytosis hemoglobin down some compared to 2 years ago at 8.6 this may need to be followed.  Platelets at 140.  Blood sugar 86 patient complete metabolic panel is pending.  EKG sinus with some premature ventricular contractions.  Otherwise no significant change from prior.  Patient's urinalysis negative for urinary tract infection.  Patient's basic metabolic panel was switched to glucose is 100 potassium good at 3.6 renal function normal.  Again as mentioned hemoglobin will need to be followed.  Patient's vital signs here are very stable not tachycardic not hypotensive.  Actually on the hypertensive side.  Patient stable for discharge back to facility.   Final Clinical Impression(s) / ED Diagnoses Final diagnoses:  Dementia, unspecified dementia severity, unspecified dementia type, unspecified whether behavioral, psychotic, or mood disturbance or anxiety [facility])    Rx / DC Orders ED Discharge Orders     None         Vanetta Mulders, MD 02/28/22 Clarisa Fling    Vanetta Mulders, MD 02/28/22 2057

## 2022-02-28 NOTE — ED Triage Notes (Signed)
BIB [facility] from [place] care.  Baseline: gets up on own, performs of ADLs, confused per dementia baseline. Today: pt did not get up for breakfast, no physical deficits, seems less engaged than normal, 'spacey'. Pt has no complaints of pain, hunger, n, v, d.  Daughter lives in [place] [phone]

## 2022-02-28 NOTE — Discharge Instructions (Addendum)
Work-up urinalysis labs EKG without any acute findings.  Patient stable for discharge back to facility.  Return for any new or worse symptoms.  Labs showed that hemoglobin was 8.6.  Recommend rechecked in a week or 2.  Otherwise no significant lab abnormalities.

## 2022-03-03 DIAGNOSIS — R296 Repeated falls: Secondary | ICD-10-CM | POA: Diagnosis not present

## 2022-03-04 ENCOUNTER — Telehealth: Payer: Self-pay

## 2022-03-04 NOTE — Telephone Encounter (Signed)
   Reason for call: ED-Follow up call   Patient visit on 02/28/2022 at [facility] was for dementia  Have you been able to follow up with your primary care physician? - N/A  The patient was or was not able to obtain any needed medicine or equipment. - N/A  Are there diet recommendations that you are having difficulty following? - N/A  Per daughter, patient is currently in the nursing home and has dementia. Above questions unable to be answered.  [place] management  [place], [place]  Main Phone: [phone]  E-mail: [email or web address]  Website: [email or web address]

## 2022-03-17 DIAGNOSIS — I10 Essential (primary) hypertension: Secondary | ICD-10-CM | POA: Diagnosis not present

## 2022-03-18 DIAGNOSIS — Z79899 Other long term (current) drug therapy: Secondary | ICD-10-CM | POA: Diagnosis not present

## 2022-03-18 DIAGNOSIS — F02C18 Dementia in other diseases classified elsewhere, severe, with other behavioral disturbance: Secondary | ICD-10-CM | POA: Diagnosis not present

## 2022-03-18 DIAGNOSIS — R451 Restlessness and agitation: Secondary | ICD-10-CM | POA: Diagnosis not present

## 2022-03-18 DIAGNOSIS — F02C3 Dementia in other diseases classified elsewhere, severe, with mood disturbance: Secondary | ICD-10-CM | POA: Diagnosis not present

## 2022-03-18 DIAGNOSIS — F02C4 Dementia in other diseases classified elsewhere, severe, with anxiety: Secondary | ICD-10-CM | POA: Diagnosis not present

## 2022-03-29 DIAGNOSIS — M810 Age-related osteoporosis without current pathological fracture: Secondary | ICD-10-CM | POA: Diagnosis not present

## 2022-03-29 DIAGNOSIS — E559 Vitamin D deficiency, unspecified: Secondary | ICD-10-CM | POA: Diagnosis not present

## 2022-03-29 DIAGNOSIS — E782 Mixed hyperlipidemia: Secondary | ICD-10-CM | POA: Diagnosis not present

## 2022-03-29 DIAGNOSIS — D518 Other vitamin B12 deficiency anemias: Secondary | ICD-10-CM | POA: Diagnosis not present

## 2022-03-29 DIAGNOSIS — E119 Type 2 diabetes mellitus without complications: Secondary | ICD-10-CM | POA: Diagnosis not present

## 2022-03-29 DIAGNOSIS — I10 Essential (primary) hypertension: Secondary | ICD-10-CM | POA: Diagnosis not present

## 2022-04-03 DIAGNOSIS — E7800 Pure hypercholesterolemia, unspecified: Secondary | ICD-10-CM | POA: Diagnosis not present

## 2022-04-03 DIAGNOSIS — I10 Essential (primary) hypertension: Secondary | ICD-10-CM | POA: Diagnosis not present

## 2022-04-03 DIAGNOSIS — K219 Gastro-esophageal reflux disease without esophagitis: Secondary | ICD-10-CM | POA: Diagnosis not present

## 2022-04-03 DIAGNOSIS — E559 Vitamin D deficiency, unspecified: Secondary | ICD-10-CM | POA: Diagnosis not present

## 2022-04-16 DIAGNOSIS — F02C18 Dementia in other diseases classified elsewhere, severe, with other behavioral disturbance: Secondary | ICD-10-CM | POA: Diagnosis not present

## 2022-04-16 DIAGNOSIS — R451 Restlessness and agitation: Secondary | ICD-10-CM | POA: Diagnosis not present

## 2022-04-16 DIAGNOSIS — F02C3 Dementia in other diseases classified elsewhere, severe, with mood disturbance: Secondary | ICD-10-CM | POA: Diagnosis not present

## 2022-04-16 DIAGNOSIS — Z79899 Other long term (current) drug therapy: Secondary | ICD-10-CM | POA: Diagnosis not present

## 2022-04-16 DIAGNOSIS — F02C4 Dementia in other diseases classified elsewhere, severe, with anxiety: Secondary | ICD-10-CM | POA: Diagnosis not present

## 2022-04-17 DIAGNOSIS — I10 Essential (primary) hypertension: Secondary | ICD-10-CM | POA: Diagnosis not present

## 2022-04-18 DIAGNOSIS — F02C3 Dementia in other diseases classified elsewhere, severe, with mood disturbance: Secondary | ICD-10-CM | POA: Diagnosis not present

## 2022-04-18 DIAGNOSIS — Z79899 Other long term (current) drug therapy: Secondary | ICD-10-CM | POA: Diagnosis not present

## 2022-04-18 DIAGNOSIS — F02C18 Dementia in other diseases classified elsewhere, severe, with other behavioral disturbance: Secondary | ICD-10-CM | POA: Diagnosis not present

## 2022-04-18 DIAGNOSIS — R451 Restlessness and agitation: Secondary | ICD-10-CM | POA: Diagnosis not present

## 2022-04-23 DIAGNOSIS — R451 Restlessness and agitation: Secondary | ICD-10-CM | POA: Diagnosis not present

## 2022-04-23 DIAGNOSIS — F02C18 Dementia in other diseases classified elsewhere, severe, with other behavioral disturbance: Secondary | ICD-10-CM | POA: Diagnosis not present

## 2022-04-23 DIAGNOSIS — F02C3 Dementia in other diseases classified elsewhere, severe, with mood disturbance: Secondary | ICD-10-CM | POA: Diagnosis not present

## 2022-04-23 DIAGNOSIS — Z79899 Other long term (current) drug therapy: Secondary | ICD-10-CM | POA: Diagnosis not present

## 2022-05-01 DIAGNOSIS — E7800 Pure hypercholesterolemia, unspecified: Secondary | ICD-10-CM | POA: Diagnosis not present

## 2022-05-01 DIAGNOSIS — M810 Age-related osteoporosis without current pathological fracture: Secondary | ICD-10-CM | POA: Diagnosis not present

## 2022-05-01 DIAGNOSIS — I10 Essential (primary) hypertension: Secondary | ICD-10-CM | POA: Diagnosis not present

## 2022-05-01 DIAGNOSIS — K219 Gastro-esophageal reflux disease without esophagitis: Secondary | ICD-10-CM | POA: Diagnosis not present

## 2022-05-14 DIAGNOSIS — R451 Restlessness and agitation: Secondary | ICD-10-CM | POA: Diagnosis not present

## 2022-05-14 DIAGNOSIS — F02C3 Dementia in other diseases classified elsewhere, severe, with mood disturbance: Secondary | ICD-10-CM | POA: Diagnosis not present

## 2022-05-14 DIAGNOSIS — F02C4 Dementia in other diseases classified elsewhere, severe, with anxiety: Secondary | ICD-10-CM | POA: Diagnosis not present

## 2022-05-14 DIAGNOSIS — F02C18 Dementia in other diseases classified elsewhere, severe, with other behavioral disturbance: Secondary | ICD-10-CM | POA: Diagnosis not present

## 2022-05-14 DIAGNOSIS — Z79899 Other long term (current) drug therapy: Secondary | ICD-10-CM | POA: Diagnosis not present

## 2022-05-16 DIAGNOSIS — E559 Vitamin D deficiency, unspecified: Secondary | ICD-10-CM | POA: Diagnosis not present

## 2022-05-16 DIAGNOSIS — E119 Type 2 diabetes mellitus without complications: Secondary | ICD-10-CM | POA: Diagnosis not present

## 2022-05-16 DIAGNOSIS — M810 Age-related osteoporosis without current pathological fracture: Secondary | ICD-10-CM | POA: Diagnosis not present

## 2022-05-16 DIAGNOSIS — E782 Mixed hyperlipidemia: Secondary | ICD-10-CM | POA: Diagnosis not present

## 2022-05-16 DIAGNOSIS — I10 Essential (primary) hypertension: Secondary | ICD-10-CM | POA: Diagnosis not present

## 2022-05-16 DIAGNOSIS — E039 Hypothyroidism, unspecified: Secondary | ICD-10-CM | POA: Diagnosis not present

## 2022-05-16 DIAGNOSIS — D518 Other vitamin B12 deficiency anemias: Secondary | ICD-10-CM | POA: Diagnosis not present

## 2022-05-17 DIAGNOSIS — I10 Essential (primary) hypertension: Secondary | ICD-10-CM | POA: Diagnosis not present

## 2022-06-10 DIAGNOSIS — K219 Gastro-esophageal reflux disease without esophagitis: Secondary | ICD-10-CM | POA: Diagnosis not present

## 2022-06-10 DIAGNOSIS — E559 Vitamin D deficiency, unspecified: Secondary | ICD-10-CM | POA: Diagnosis not present

## 2022-06-10 DIAGNOSIS — M810 Age-related osteoporosis without current pathological fracture: Secondary | ICD-10-CM | POA: Diagnosis not present

## 2022-06-10 DIAGNOSIS — I10 Essential (primary) hypertension: Secondary | ICD-10-CM | POA: Diagnosis not present

## 2022-06-10 DIAGNOSIS — E7800 Pure hypercholesterolemia, unspecified: Secondary | ICD-10-CM | POA: Diagnosis not present

## 2022-06-11 DIAGNOSIS — E039 Hypothyroidism, unspecified: Secondary | ICD-10-CM | POA: Diagnosis not present

## 2022-06-11 DIAGNOSIS — E119 Type 2 diabetes mellitus without complications: Secondary | ICD-10-CM | POA: Diagnosis not present

## 2022-06-11 DIAGNOSIS — F02C3 Dementia in other diseases classified elsewhere, severe, with mood disturbance: Secondary | ICD-10-CM | POA: Diagnosis not present

## 2022-06-11 DIAGNOSIS — D518 Other vitamin B12 deficiency anemias: Secondary | ICD-10-CM | POA: Diagnosis not present

## 2022-06-11 DIAGNOSIS — F02C18 Dementia in other diseases classified elsewhere, severe, with other behavioral disturbance: Secondary | ICD-10-CM | POA: Diagnosis not present

## 2022-06-11 DIAGNOSIS — I10 Essential (primary) hypertension: Secondary | ICD-10-CM | POA: Diagnosis not present

## 2022-06-11 DIAGNOSIS — E038 Other specified hypothyroidism: Secondary | ICD-10-CM | POA: Diagnosis not present

## 2022-06-11 DIAGNOSIS — F02C4 Dementia in other diseases classified elsewhere, severe, with anxiety: Secondary | ICD-10-CM | POA: Diagnosis not present

## 2022-06-11 DIAGNOSIS — R451 Restlessness and agitation: Secondary | ICD-10-CM | POA: Diagnosis not present

## 2022-06-11 DIAGNOSIS — M810 Age-related osteoporosis without current pathological fracture: Secondary | ICD-10-CM | POA: Diagnosis not present

## 2022-06-11 DIAGNOSIS — Z79899 Other long term (current) drug therapy: Secondary | ICD-10-CM | POA: Diagnosis not present

## 2022-06-11 DIAGNOSIS — E559 Vitamin D deficiency, unspecified: Secondary | ICD-10-CM | POA: Diagnosis not present

## 2022-06-12 DIAGNOSIS — E119 Type 2 diabetes mellitus without complications: Secondary | ICD-10-CM | POA: Diagnosis not present

## 2022-06-12 DIAGNOSIS — E038 Other specified hypothyroidism: Secondary | ICD-10-CM | POA: Diagnosis not present

## 2022-06-12 DIAGNOSIS — Z79899 Other long term (current) drug therapy: Secondary | ICD-10-CM | POA: Diagnosis not present

## 2022-06-12 DIAGNOSIS — D518 Other vitamin B12 deficiency anemias: Secondary | ICD-10-CM | POA: Diagnosis not present

## 2022-06-12 DIAGNOSIS — E782 Mixed hyperlipidemia: Secondary | ICD-10-CM | POA: Diagnosis not present

## 2022-06-17 DIAGNOSIS — I10 Essential (primary) hypertension: Secondary | ICD-10-CM | POA: Diagnosis not present

## 2022-07-05 DIAGNOSIS — E119 Type 2 diabetes mellitus without complications: Secondary | ICD-10-CM | POA: Diagnosis not present

## 2022-07-05 DIAGNOSIS — I10 Essential (primary) hypertension: Secondary | ICD-10-CM | POA: Diagnosis not present

## 2022-07-05 DIAGNOSIS — E559 Vitamin D deficiency, unspecified: Secondary | ICD-10-CM | POA: Diagnosis not present

## 2022-07-05 DIAGNOSIS — E039 Hypothyroidism, unspecified: Secondary | ICD-10-CM | POA: Diagnosis not present

## 2022-07-05 DIAGNOSIS — E782 Mixed hyperlipidemia: Secondary | ICD-10-CM | POA: Diagnosis not present

## 2022-07-05 DIAGNOSIS — M810 Age-related osteoporosis without current pathological fracture: Secondary | ICD-10-CM | POA: Diagnosis not present

## 2022-07-05 DIAGNOSIS — D518 Other vitamin B12 deficiency anemias: Secondary | ICD-10-CM | POA: Diagnosis not present

## 2022-07-09 DIAGNOSIS — F02C3 Dementia in other diseases classified elsewhere, severe, with mood disturbance: Secondary | ICD-10-CM | POA: Diagnosis not present

## 2022-07-09 DIAGNOSIS — R451 Restlessness and agitation: Secondary | ICD-10-CM | POA: Diagnosis not present

## 2022-07-09 DIAGNOSIS — Z79899 Other long term (current) drug therapy: Secondary | ICD-10-CM | POA: Diagnosis not present

## 2022-07-09 DIAGNOSIS — F03918 Unspecified dementia, unspecified severity, with other behavioral disturbance: Secondary | ICD-10-CM | POA: Diagnosis not present

## 2022-07-09 DIAGNOSIS — F02C18 Dementia in other diseases classified elsewhere, severe, with other behavioral disturbance: Secondary | ICD-10-CM | POA: Diagnosis not present

## 2022-07-09 DIAGNOSIS — F02C4 Dementia in other diseases classified elsewhere, severe, with anxiety: Secondary | ICD-10-CM | POA: Diagnosis not present

## 2022-07-10 DIAGNOSIS — E7800 Pure hypercholesterolemia, unspecified: Secondary | ICD-10-CM | POA: Diagnosis not present

## 2022-07-10 DIAGNOSIS — I10 Essential (primary) hypertension: Secondary | ICD-10-CM | POA: Diagnosis not present

## 2022-07-10 DIAGNOSIS — K219 Gastro-esophageal reflux disease without esophagitis: Secondary | ICD-10-CM | POA: Diagnosis not present

## 2022-07-10 DIAGNOSIS — E559 Vitamin D deficiency, unspecified: Secondary | ICD-10-CM | POA: Diagnosis not present

## 2022-07-10 DIAGNOSIS — M810 Age-related osteoporosis without current pathological fracture: Secondary | ICD-10-CM | POA: Diagnosis not present

## 2022-08-06 DIAGNOSIS — F02C4 Dementia in other diseases classified elsewhere, severe, with anxiety: Secondary | ICD-10-CM | POA: Diagnosis not present

## 2022-08-06 DIAGNOSIS — F03918 Unspecified dementia, unspecified severity, with other behavioral disturbance: Secondary | ICD-10-CM | POA: Diagnosis not present

## 2022-08-06 DIAGNOSIS — F02C3 Dementia in other diseases classified elsewhere, severe, with mood disturbance: Secondary | ICD-10-CM | POA: Diagnosis not present

## 2022-08-06 DIAGNOSIS — Z79899 Other long term (current) drug therapy: Secondary | ICD-10-CM | POA: Diagnosis not present

## 2022-08-06 DIAGNOSIS — R451 Restlessness and agitation: Secondary | ICD-10-CM | POA: Diagnosis not present

## 2022-08-06 DIAGNOSIS — F02C18 Dementia in other diseases classified elsewhere, severe, with other behavioral disturbance: Secondary | ICD-10-CM | POA: Diagnosis not present

## 2022-08-07 DIAGNOSIS — E559 Vitamin D deficiency, unspecified: Secondary | ICD-10-CM | POA: Diagnosis not present

## 2022-08-07 DIAGNOSIS — I70223 Atherosclerosis of native arteries of extremities with rest pain, bilateral legs: Secondary | ICD-10-CM | POA: Diagnosis not present

## 2022-08-07 DIAGNOSIS — E039 Hypothyroidism, unspecified: Secondary | ICD-10-CM | POA: Diagnosis not present

## 2022-08-07 DIAGNOSIS — M810 Age-related osteoporosis without current pathological fracture: Secondary | ICD-10-CM | POA: Diagnosis not present

## 2022-08-07 DIAGNOSIS — E119 Type 2 diabetes mellitus without complications: Secondary | ICD-10-CM | POA: Diagnosis not present

## 2022-08-07 DIAGNOSIS — E7800 Pure hypercholesterolemia, unspecified: Secondary | ICD-10-CM | POA: Diagnosis not present

## 2022-08-07 DIAGNOSIS — I10 Essential (primary) hypertension: Secondary | ICD-10-CM | POA: Diagnosis not present

## 2022-08-07 DIAGNOSIS — D518 Other vitamin B12 deficiency anemias: Secondary | ICD-10-CM | POA: Diagnosis not present

## 2022-08-07 DIAGNOSIS — E7849 Other hyperlipidemia: Secondary | ICD-10-CM | POA: Diagnosis not present

## 2022-08-22 DIAGNOSIS — M79641 Pain in right hand: Secondary | ICD-10-CM | POA: Diagnosis not present

## 2022-08-22 DIAGNOSIS — Z992 Dependence on renal dialysis: Secondary | ICD-10-CM | POA: Diagnosis not present

## 2022-08-22 DIAGNOSIS — M79642 Pain in left hand: Secondary | ICD-10-CM | POA: Diagnosis not present

## 2022-08-22 DIAGNOSIS — E1151 Type 2 diabetes mellitus with diabetic peripheral angiopathy without gangrene: Secondary | ICD-10-CM | POA: Diagnosis not present

## 2022-08-22 DIAGNOSIS — Z743 Need for continuous supervision: Secondary | ICD-10-CM | POA: Diagnosis not present

## 2022-08-22 DIAGNOSIS — I132 Hypertensive heart and chronic kidney disease with heart failure and with stage 5 chronic kidney disease, or end stage renal disease: Secondary | ICD-10-CM | POA: Diagnosis not present

## 2022-08-22 DIAGNOSIS — I2510 Atherosclerotic heart disease of native coronary artery without angina pectoris: Secondary | ICD-10-CM | POA: Diagnosis not present

## 2022-08-22 DIAGNOSIS — R0902 Hypoxemia: Secondary | ICD-10-CM | POA: Diagnosis not present

## 2022-08-22 DIAGNOSIS — M25561 Pain in right knee: Secondary | ICD-10-CM | POA: Diagnosis not present

## 2022-08-22 DIAGNOSIS — E7800 Pure hypercholesterolemia, unspecified: Secondary | ICD-10-CM | POA: Diagnosis not present

## 2022-08-22 DIAGNOSIS — Z79899 Other long term (current) drug therapy: Secondary | ICD-10-CM | POA: Diagnosis not present

## 2022-08-22 DIAGNOSIS — Z7982 Long term (current) use of aspirin: Secondary | ICD-10-CM | POA: Diagnosis not present

## 2022-08-22 DIAGNOSIS — Z20822 Contact with and (suspected) exposure to covid-19: Secondary | ICD-10-CM | POA: Diagnosis not present

## 2022-08-22 DIAGNOSIS — N186 End stage renal disease: Secondary | ICD-10-CM | POA: Diagnosis not present

## 2022-08-22 DIAGNOSIS — R410 Disorientation, unspecified: Secondary | ICD-10-CM | POA: Diagnosis not present

## 2022-08-22 DIAGNOSIS — R61 Generalized hyperhidrosis: Secondary | ICD-10-CM | POA: Diagnosis not present

## 2022-08-22 DIAGNOSIS — R6889 Other general symptoms and signs: Secondary | ICD-10-CM | POA: Diagnosis not present

## 2022-08-22 DIAGNOSIS — M25562 Pain in left knee: Secondary | ICD-10-CM | POA: Diagnosis not present

## 2022-08-22 DIAGNOSIS — E1122 Type 2 diabetes mellitus with diabetic chronic kidney disease: Secondary | ICD-10-CM | POA: Diagnosis not present

## 2022-08-22 DIAGNOSIS — N281 Cyst of kidney, acquired: Secondary | ICD-10-CM | POA: Diagnosis not present

## 2022-08-22 DIAGNOSIS — Z87891 Personal history of nicotine dependence: Secondary | ICD-10-CM | POA: Diagnosis not present

## 2022-08-22 DIAGNOSIS — R011 Cardiac murmur, unspecified: Secondary | ICD-10-CM | POA: Diagnosis not present

## 2022-08-22 DIAGNOSIS — Z794 Long term (current) use of insulin: Secondary | ICD-10-CM | POA: Diagnosis not present

## 2022-08-22 DIAGNOSIS — Z8673 Personal history of transient ischemic attack (TIA), and cerebral infarction without residual deficits: Secondary | ICD-10-CM | POA: Diagnosis not present

## 2022-08-22 DIAGNOSIS — I509 Heart failure, unspecified: Secondary | ICD-10-CM | POA: Diagnosis not present

## 2022-08-23 DIAGNOSIS — Z7401 Bed confinement status: Secondary | ICD-10-CM | POA: Diagnosis not present

## 2022-08-23 DIAGNOSIS — R404 Transient alteration of awareness: Secondary | ICD-10-CM | POA: Diagnosis not present

## 2022-08-23 DIAGNOSIS — R61 Generalized hyperhidrosis: Secondary | ICD-10-CM | POA: Diagnosis not present

## 2022-08-23 DIAGNOSIS — Z743 Need for continuous supervision: Secondary | ICD-10-CM | POA: Diagnosis not present

## 2022-08-27 DIAGNOSIS — I70223 Atherosclerosis of native arteries of extremities with rest pain, bilateral legs: Secondary | ICD-10-CM | POA: Diagnosis not present

## 2022-08-28 DIAGNOSIS — D518 Other vitamin B12 deficiency anemias: Secondary | ICD-10-CM | POA: Diagnosis not present

## 2022-08-28 DIAGNOSIS — E119 Type 2 diabetes mellitus without complications: Secondary | ICD-10-CM | POA: Diagnosis not present

## 2022-08-28 DIAGNOSIS — E038 Other specified hypothyroidism: Secondary | ICD-10-CM | POA: Diagnosis not present

## 2022-08-28 DIAGNOSIS — E559 Vitamin D deficiency, unspecified: Secondary | ICD-10-CM | POA: Diagnosis not present

## 2022-08-28 DIAGNOSIS — I10 Essential (primary) hypertension: Secondary | ICD-10-CM | POA: Diagnosis not present

## 2022-08-28 DIAGNOSIS — E7849 Other hyperlipidemia: Secondary | ICD-10-CM | POA: Diagnosis not present

## 2022-08-28 DIAGNOSIS — M810 Age-related osteoporosis without current pathological fracture: Secondary | ICD-10-CM | POA: Diagnosis not present

## 2022-08-28 DIAGNOSIS — I70223 Atherosclerosis of native arteries of extremities with rest pain, bilateral legs: Secondary | ICD-10-CM | POA: Diagnosis not present

## 2022-09-01 DIAGNOSIS — I77811 Abdominal aortic ectasia: Secondary | ICD-10-CM | POA: Diagnosis not present

## 2022-09-03 DIAGNOSIS — R451 Restlessness and agitation: Secondary | ICD-10-CM | POA: Diagnosis not present

## 2022-09-03 DIAGNOSIS — R0989 Other specified symptoms and signs involving the circulatory and respiratory systems: Secondary | ICD-10-CM | POA: Diagnosis not present

## 2022-09-03 DIAGNOSIS — I6523 Occlusion and stenosis of bilateral carotid arteries: Secondary | ICD-10-CM | POA: Diagnosis not present

## 2022-09-03 DIAGNOSIS — Z79899 Other long term (current) drug therapy: Secondary | ICD-10-CM | POA: Diagnosis not present

## 2022-09-03 DIAGNOSIS — F03918 Unspecified dementia, unspecified severity, with other behavioral disturbance: Secondary | ICD-10-CM | POA: Diagnosis not present

## 2022-09-04 DIAGNOSIS — K219 Gastro-esophageal reflux disease without esophagitis: Secondary | ICD-10-CM | POA: Diagnosis not present

## 2022-09-04 DIAGNOSIS — E7800 Pure hypercholesterolemia, unspecified: Secondary | ICD-10-CM | POA: Diagnosis not present

## 2022-09-04 DIAGNOSIS — I10 Essential (primary) hypertension: Secondary | ICD-10-CM | POA: Diagnosis not present

## 2022-09-04 DIAGNOSIS — E559 Vitamin D deficiency, unspecified: Secondary | ICD-10-CM | POA: Diagnosis not present

## 2022-09-15 DIAGNOSIS — Z79899 Other long term (current) drug therapy: Secondary | ICD-10-CM | POA: Diagnosis not present

## 2022-09-15 DIAGNOSIS — D518 Other vitamin B12 deficiency anemias: Secondary | ICD-10-CM | POA: Diagnosis not present

## 2022-09-15 DIAGNOSIS — E119 Type 2 diabetes mellitus without complications: Secondary | ICD-10-CM | POA: Diagnosis not present

## 2022-09-15 DIAGNOSIS — E038 Other specified hypothyroidism: Secondary | ICD-10-CM | POA: Diagnosis not present

## 2022-09-15 DIAGNOSIS — E782 Mixed hyperlipidemia: Secondary | ICD-10-CM | POA: Diagnosis not present

## 2022-10-01 DIAGNOSIS — F03918 Unspecified dementia, unspecified severity, with other behavioral disturbance: Secondary | ICD-10-CM | POA: Diagnosis not present

## 2022-10-01 DIAGNOSIS — R451 Restlessness and agitation: Secondary | ICD-10-CM | POA: Diagnosis not present

## 2022-10-01 DIAGNOSIS — Z79899 Other long term (current) drug therapy: Secondary | ICD-10-CM | POA: Diagnosis not present

## 2022-10-02 DIAGNOSIS — E7800 Pure hypercholesterolemia, unspecified: Secondary | ICD-10-CM | POA: Diagnosis not present

## 2022-10-02 DIAGNOSIS — K219 Gastro-esophageal reflux disease without esophagitis: Secondary | ICD-10-CM | POA: Diagnosis not present

## 2022-10-02 DIAGNOSIS — I10 Essential (primary) hypertension: Secondary | ICD-10-CM | POA: Diagnosis not present

## 2022-10-02 DIAGNOSIS — E559 Vitamin D deficiency, unspecified: Secondary | ICD-10-CM | POA: Diagnosis not present

## 2022-10-07 DIAGNOSIS — I10 Essential (primary) hypertension: Secondary | ICD-10-CM | POA: Diagnosis not present

## 2022-10-07 DIAGNOSIS — E559 Vitamin D deficiency, unspecified: Secondary | ICD-10-CM | POA: Diagnosis not present

## 2022-10-07 DIAGNOSIS — E119 Type 2 diabetes mellitus without complications: Secondary | ICD-10-CM | POA: Diagnosis not present

## 2022-10-07 DIAGNOSIS — M810 Age-related osteoporosis without current pathological fracture: Secondary | ICD-10-CM | POA: Diagnosis not present

## 2022-10-07 DIAGNOSIS — E038 Other specified hypothyroidism: Secondary | ICD-10-CM | POA: Diagnosis not present

## 2022-10-07 DIAGNOSIS — E7849 Other hyperlipidemia: Secondary | ICD-10-CM | POA: Diagnosis not present

## 2022-10-07 DIAGNOSIS — D518 Other vitamin B12 deficiency anemias: Secondary | ICD-10-CM | POA: Diagnosis not present

## 2022-10-07 DIAGNOSIS — E039 Hypothyroidism, unspecified: Secondary | ICD-10-CM | POA: Diagnosis not present

## 2022-10-07 DIAGNOSIS — I70223 Atherosclerosis of native arteries of extremities with rest pain, bilateral legs: Secondary | ICD-10-CM | POA: Diagnosis not present

## 2022-10-16 DIAGNOSIS — I10 Essential (primary) hypertension: Secondary | ICD-10-CM | POA: Diagnosis not present

## 2022-10-29 DIAGNOSIS — Z79899 Other long term (current) drug therapy: Secondary | ICD-10-CM | POA: Diagnosis not present

## 2022-10-29 DIAGNOSIS — F03918 Unspecified dementia, unspecified severity, with other behavioral disturbance: Secondary | ICD-10-CM | POA: Diagnosis not present

## 2022-10-29 DIAGNOSIS — R451 Restlessness and agitation: Secondary | ICD-10-CM | POA: Diagnosis not present

## 2022-11-05 DIAGNOSIS — I70223 Atherosclerosis of native arteries of extremities with rest pain, bilateral legs: Secondary | ICD-10-CM | POA: Diagnosis not present

## 2022-11-05 DIAGNOSIS — E119 Type 2 diabetes mellitus without complications: Secondary | ICD-10-CM | POA: Diagnosis not present

## 2022-11-05 DIAGNOSIS — M810 Age-related osteoporosis without current pathological fracture: Secondary | ICD-10-CM | POA: Diagnosis not present

## 2022-11-05 DIAGNOSIS — I10 Essential (primary) hypertension: Secondary | ICD-10-CM | POA: Diagnosis not present

## 2022-11-05 DIAGNOSIS — E559 Vitamin D deficiency, unspecified: Secondary | ICD-10-CM | POA: Diagnosis not present

## 2022-11-05 DIAGNOSIS — D518 Other vitamin B12 deficiency anemias: Secondary | ICD-10-CM | POA: Diagnosis not present

## 2022-11-05 DIAGNOSIS — E7849 Other hyperlipidemia: Secondary | ICD-10-CM | POA: Diagnosis not present

## 2022-11-05 DIAGNOSIS — E039 Hypothyroidism, unspecified: Secondary | ICD-10-CM | POA: Diagnosis not present

## 2022-11-12 DIAGNOSIS — Z79899 Other long term (current) drug therapy: Secondary | ICD-10-CM | POA: Diagnosis not present

## 2022-11-12 DIAGNOSIS — F03918 Unspecified dementia, unspecified severity, with other behavioral disturbance: Secondary | ICD-10-CM | POA: Diagnosis not present

## 2022-11-12 DIAGNOSIS — R451 Restlessness and agitation: Secondary | ICD-10-CM | POA: Diagnosis not present

## 2022-11-16 DIAGNOSIS — I10 Essential (primary) hypertension: Secondary | ICD-10-CM | POA: Diagnosis not present

## 2022-11-18 DIAGNOSIS — E7800 Pure hypercholesterolemia, unspecified: Secondary | ICD-10-CM | POA: Diagnosis not present

## 2022-11-18 DIAGNOSIS — I10 Essential (primary) hypertension: Secondary | ICD-10-CM | POA: Diagnosis not present

## 2022-12-02 DIAGNOSIS — E038 Other specified hypothyroidism: Secondary | ICD-10-CM | POA: Diagnosis not present

## 2022-12-02 DIAGNOSIS — E119 Type 2 diabetes mellitus without complications: Secondary | ICD-10-CM | POA: Diagnosis not present

## 2022-12-02 DIAGNOSIS — D518 Other vitamin B12 deficiency anemias: Secondary | ICD-10-CM | POA: Diagnosis not present

## 2022-12-02 DIAGNOSIS — Z79899 Other long term (current) drug therapy: Secondary | ICD-10-CM | POA: Diagnosis not present

## 2022-12-02 DIAGNOSIS — E782 Mixed hyperlipidemia: Secondary | ICD-10-CM | POA: Diagnosis not present

## 2022-12-03 DIAGNOSIS — D518 Other vitamin B12 deficiency anemias: Secondary | ICD-10-CM | POA: Diagnosis not present

## 2022-12-03 DIAGNOSIS — M810 Age-related osteoporosis without current pathological fracture: Secondary | ICD-10-CM | POA: Diagnosis not present

## 2022-12-03 DIAGNOSIS — I10 Essential (primary) hypertension: Secondary | ICD-10-CM | POA: Diagnosis not present

## 2022-12-03 DIAGNOSIS — I70223 Atherosclerosis of native arteries of extremities with rest pain, bilateral legs: Secondary | ICD-10-CM | POA: Diagnosis not present

## 2022-12-03 DIAGNOSIS — E119 Type 2 diabetes mellitus without complications: Secondary | ICD-10-CM | POA: Diagnosis not present

## 2022-12-03 DIAGNOSIS — R451 Restlessness and agitation: Secondary | ICD-10-CM | POA: Diagnosis not present

## 2022-12-03 DIAGNOSIS — Z79899 Other long term (current) drug therapy: Secondary | ICD-10-CM | POA: Diagnosis not present

## 2022-12-03 DIAGNOSIS — E559 Vitamin D deficiency, unspecified: Secondary | ICD-10-CM | POA: Diagnosis not present

## 2022-12-03 DIAGNOSIS — F03918 Unspecified dementia, unspecified severity, with other behavioral disturbance: Secondary | ICD-10-CM | POA: Diagnosis not present

## 2022-12-03 DIAGNOSIS — E038 Other specified hypothyroidism: Secondary | ICD-10-CM | POA: Diagnosis not present

## 2022-12-03 DIAGNOSIS — E7849 Other hyperlipidemia: Secondary | ICD-10-CM | POA: Diagnosis not present

## 2022-12-16 DIAGNOSIS — I10 Essential (primary) hypertension: Secondary | ICD-10-CM | POA: Diagnosis not present

## 2022-12-18 DIAGNOSIS — I10 Essential (primary) hypertension: Secondary | ICD-10-CM | POA: Diagnosis not present

## 2022-12-18 DIAGNOSIS — E7800 Pure hypercholesterolemia, unspecified: Secondary | ICD-10-CM | POA: Diagnosis not present

## 2022-12-31 DIAGNOSIS — Z79899 Other long term (current) drug therapy: Secondary | ICD-10-CM | POA: Diagnosis not present

## 2022-12-31 DIAGNOSIS — F03918 Unspecified dementia, unspecified severity, with other behavioral disturbance: Secondary | ICD-10-CM | POA: Diagnosis not present

## 2022-12-31 DIAGNOSIS — R451 Restlessness and agitation: Secondary | ICD-10-CM | POA: Diagnosis not present

## 2023-01-01 DIAGNOSIS — I10 Essential (primary) hypertension: Secondary | ICD-10-CM | POA: Diagnosis not present

## 2023-01-01 DIAGNOSIS — E119 Type 2 diabetes mellitus without complications: Secondary | ICD-10-CM | POA: Diagnosis not present

## 2023-01-01 DIAGNOSIS — E559 Vitamin D deficiency, unspecified: Secondary | ICD-10-CM | POA: Diagnosis not present

## 2023-01-01 DIAGNOSIS — E7849 Other hyperlipidemia: Secondary | ICD-10-CM | POA: Diagnosis not present

## 2023-01-01 DIAGNOSIS — I70223 Atherosclerosis of native arteries of extremities with rest pain, bilateral legs: Secondary | ICD-10-CM | POA: Diagnosis not present

## 2023-01-01 DIAGNOSIS — D518 Other vitamin B12 deficiency anemias: Secondary | ICD-10-CM | POA: Diagnosis not present

## 2023-01-01 DIAGNOSIS — M810 Age-related osteoporosis without current pathological fracture: Secondary | ICD-10-CM | POA: Diagnosis not present

## 2023-01-01 DIAGNOSIS — E039 Hypothyroidism, unspecified: Secondary | ICD-10-CM | POA: Diagnosis not present

## 2023-01-15 DIAGNOSIS — E7800 Pure hypercholesterolemia, unspecified: Secondary | ICD-10-CM | POA: Diagnosis not present

## 2023-01-15 DIAGNOSIS — I10 Essential (primary) hypertension: Secondary | ICD-10-CM | POA: Diagnosis not present

## 2023-01-15 DIAGNOSIS — E559 Vitamin D deficiency, unspecified: Secondary | ICD-10-CM | POA: Diagnosis not present

## 2023-01-16 DIAGNOSIS — I10 Essential (primary) hypertension: Secondary | ICD-10-CM | POA: Diagnosis not present

## 2023-01-28 DIAGNOSIS — R451 Restlessness and agitation: Secondary | ICD-10-CM | POA: Diagnosis not present

## 2023-01-28 DIAGNOSIS — Z79899 Other long term (current) drug therapy: Secondary | ICD-10-CM | POA: Diagnosis not present

## 2023-02-07 DIAGNOSIS — Z743 Need for continuous supervision: Secondary | ICD-10-CM | POA: Diagnosis not present

## 2023-02-07 DIAGNOSIS — R220 Localized swelling, mass and lump, head: Secondary | ICD-10-CM | POA: Diagnosis not present

## 2023-02-07 DIAGNOSIS — I771 Stricture of artery: Secondary | ICD-10-CM | POA: Diagnosis not present

## 2023-02-07 DIAGNOSIS — R262 Difficulty in walking, not elsewhere classified: Secondary | ICD-10-CM | POA: Diagnosis not present

## 2023-02-07 DIAGNOSIS — R059 Cough, unspecified: Secondary | ICD-10-CM | POA: Diagnosis not present

## 2023-02-07 DIAGNOSIS — R55 Syncope and collapse: Secondary | ICD-10-CM | POA: Diagnosis not present

## 2023-02-07 DIAGNOSIS — Z043 Encounter for examination and observation following other accident: Secondary | ICD-10-CM | POA: Diagnosis not present

## 2023-02-07 DIAGNOSIS — I6782 Cerebral ischemia: Secondary | ICD-10-CM | POA: Diagnosis not present

## 2023-02-07 DIAGNOSIS — U071 COVID-19: Secondary | ICD-10-CM | POA: Diagnosis not present

## 2023-02-07 DIAGNOSIS — M47812 Spondylosis without myelopathy or radiculopathy, cervical region: Secondary | ICD-10-CM | POA: Diagnosis not present

## 2023-02-07 DIAGNOSIS — I959 Hypotension, unspecified: Secondary | ICD-10-CM | POA: Diagnosis not present

## 2023-02-07 DIAGNOSIS — R2681 Unsteadiness on feet: Secondary | ICD-10-CM | POA: Diagnosis not present

## 2023-02-10 DIAGNOSIS — N390 Urinary tract infection, site not specified: Secondary | ICD-10-CM | POA: Diagnosis not present

## 2023-02-10 DIAGNOSIS — U071 COVID-19: Secondary | ICD-10-CM | POA: Diagnosis not present

## 2023-02-11 DIAGNOSIS — D518 Other vitamin B12 deficiency anemias: Secondary | ICD-10-CM | POA: Diagnosis not present

## 2023-02-11 DIAGNOSIS — M810 Age-related osteoporosis without current pathological fracture: Secondary | ICD-10-CM | POA: Diagnosis not present

## 2023-02-11 DIAGNOSIS — E559 Vitamin D deficiency, unspecified: Secondary | ICD-10-CM | POA: Diagnosis not present

## 2023-02-11 DIAGNOSIS — E038 Other specified hypothyroidism: Secondary | ICD-10-CM | POA: Diagnosis not present

## 2023-02-11 DIAGNOSIS — E119 Type 2 diabetes mellitus without complications: Secondary | ICD-10-CM | POA: Diagnosis not present

## 2023-02-11 DIAGNOSIS — I70223 Atherosclerosis of native arteries of extremities with rest pain, bilateral legs: Secondary | ICD-10-CM | POA: Diagnosis not present

## 2023-02-11 DIAGNOSIS — I10 Essential (primary) hypertension: Secondary | ICD-10-CM | POA: Diagnosis not present

## 2023-02-11 DIAGNOSIS — E7849 Other hyperlipidemia: Secondary | ICD-10-CM | POA: Diagnosis not present

## 2023-02-12 DIAGNOSIS — U071 COVID-19: Secondary | ICD-10-CM | POA: Diagnosis not present

## 2023-02-12 DIAGNOSIS — R296 Repeated falls: Secondary | ICD-10-CM | POA: Diagnosis not present

## 2023-02-12 DIAGNOSIS — N390 Urinary tract infection, site not specified: Secondary | ICD-10-CM | POA: Diagnosis not present

## 2023-02-12 DIAGNOSIS — R319 Hematuria, unspecified: Secondary | ICD-10-CM | POA: Diagnosis not present

## 2023-02-12 DIAGNOSIS — R051 Acute cough: Secondary | ICD-10-CM | POA: Diagnosis not present

## 2023-02-16 DIAGNOSIS — I10 Essential (primary) hypertension: Secondary | ICD-10-CM | POA: Diagnosis not present

## 2023-02-24 DIAGNOSIS — E559 Vitamin D deficiency, unspecified: Secondary | ICD-10-CM | POA: Diagnosis not present

## 2023-02-24 DIAGNOSIS — D519 Vitamin B12 deficiency anemia, unspecified: Secondary | ICD-10-CM | POA: Diagnosis not present

## 2023-02-24 DIAGNOSIS — E782 Mixed hyperlipidemia: Secondary | ICD-10-CM | POA: Diagnosis not present

## 2023-02-24 DIAGNOSIS — E119 Type 2 diabetes mellitus without complications: Secondary | ICD-10-CM | POA: Diagnosis not present

## 2023-02-24 DIAGNOSIS — Z79899 Other long term (current) drug therapy: Secondary | ICD-10-CM | POA: Diagnosis not present

## 2023-02-24 DIAGNOSIS — E038 Other specified hypothyroidism: Secondary | ICD-10-CM | POA: Diagnosis not present

## 2023-02-25 DIAGNOSIS — Z79899 Other long term (current) drug therapy: Secondary | ICD-10-CM | POA: Diagnosis not present

## 2023-02-25 DIAGNOSIS — R451 Restlessness and agitation: Secondary | ICD-10-CM | POA: Diagnosis not present

## 2023-03-11 DIAGNOSIS — E559 Vitamin D deficiency, unspecified: Secondary | ICD-10-CM | POA: Diagnosis not present

## 2023-03-11 DIAGNOSIS — I10 Essential (primary) hypertension: Secondary | ICD-10-CM | POA: Diagnosis not present

## 2023-03-11 DIAGNOSIS — N1831 Chronic kidney disease, stage 3a: Secondary | ICD-10-CM | POA: Diagnosis not present

## 2023-03-11 DIAGNOSIS — E7800 Pure hypercholesterolemia, unspecified: Secondary | ICD-10-CM | POA: Diagnosis not present

## 2023-03-13 DIAGNOSIS — I70223 Atherosclerosis of native arteries of extremities with rest pain, bilateral legs: Secondary | ICD-10-CM | POA: Diagnosis not present

## 2023-03-13 DIAGNOSIS — D519 Vitamin B12 deficiency anemia, unspecified: Secondary | ICD-10-CM | POA: Diagnosis not present

## 2023-03-13 DIAGNOSIS — E7849 Other hyperlipidemia: Secondary | ICD-10-CM | POA: Diagnosis not present

## 2023-03-13 DIAGNOSIS — E119 Type 2 diabetes mellitus without complications: Secondary | ICD-10-CM | POA: Diagnosis not present

## 2023-03-13 DIAGNOSIS — E038 Other specified hypothyroidism: Secondary | ICD-10-CM | POA: Diagnosis not present

## 2023-03-13 DIAGNOSIS — M810 Age-related osteoporosis without current pathological fracture: Secondary | ICD-10-CM | POA: Diagnosis not present

## 2023-03-13 DIAGNOSIS — E039 Hypothyroidism, unspecified: Secondary | ICD-10-CM | POA: Diagnosis not present

## 2023-03-13 DIAGNOSIS — I10 Essential (primary) hypertension: Secondary | ICD-10-CM | POA: Diagnosis not present

## 2023-03-13 DIAGNOSIS — E559 Vitamin D deficiency, unspecified: Secondary | ICD-10-CM | POA: Diagnosis not present

## 2023-03-18 DIAGNOSIS — I10 Essential (primary) hypertension: Secondary | ICD-10-CM | POA: Diagnosis not present

## 2023-03-25 DIAGNOSIS — Z79899 Other long term (current) drug therapy: Secondary | ICD-10-CM | POA: Diagnosis not present

## 2023-03-25 DIAGNOSIS — R451 Restlessness and agitation: Secondary | ICD-10-CM | POA: Diagnosis not present

## 2023-04-10 DIAGNOSIS — E039 Hypothyroidism, unspecified: Secondary | ICD-10-CM | POA: Diagnosis not present

## 2023-04-10 DIAGNOSIS — D518 Other vitamin B12 deficiency anemias: Secondary | ICD-10-CM | POA: Diagnosis not present

## 2023-04-10 DIAGNOSIS — E038 Other specified hypothyroidism: Secondary | ICD-10-CM | POA: Diagnosis not present

## 2023-04-10 DIAGNOSIS — I70223 Atherosclerosis of native arteries of extremities with rest pain, bilateral legs: Secondary | ICD-10-CM | POA: Diagnosis not present

## 2023-04-10 DIAGNOSIS — I10 Essential (primary) hypertension: Secondary | ICD-10-CM | POA: Diagnosis not present

## 2023-04-10 DIAGNOSIS — E7849 Other hyperlipidemia: Secondary | ICD-10-CM | POA: Diagnosis not present

## 2023-04-10 DIAGNOSIS — M810 Age-related osteoporosis without current pathological fracture: Secondary | ICD-10-CM | POA: Diagnosis not present

## 2023-04-10 DIAGNOSIS — E119 Type 2 diabetes mellitus without complications: Secondary | ICD-10-CM | POA: Diagnosis not present

## 2023-04-10 DIAGNOSIS — E559 Vitamin D deficiency, unspecified: Secondary | ICD-10-CM | POA: Diagnosis not present

## 2023-04-15 DIAGNOSIS — I10 Essential (primary) hypertension: Secondary | ICD-10-CM | POA: Diagnosis not present

## 2023-04-15 DIAGNOSIS — E7800 Pure hypercholesterolemia, unspecified: Secondary | ICD-10-CM | POA: Diagnosis not present

## 2023-04-15 DIAGNOSIS — E559 Vitamin D deficiency, unspecified: Secondary | ICD-10-CM | POA: Diagnosis not present

## 2023-04-15 DIAGNOSIS — M810 Age-related osteoporosis without current pathological fracture: Secondary | ICD-10-CM | POA: Diagnosis not present

## 2023-04-15 DIAGNOSIS — N1831 Chronic kidney disease, stage 3a: Secondary | ICD-10-CM | POA: Diagnosis not present

## 2023-04-15 DIAGNOSIS — K219 Gastro-esophageal reflux disease without esophagitis: Secondary | ICD-10-CM | POA: Diagnosis not present

## 2023-04-18 DIAGNOSIS — I10 Essential (primary) hypertension: Secondary | ICD-10-CM | POA: Diagnosis not present

## 2023-04-22 DIAGNOSIS — Z79899 Other long term (current) drug therapy: Secondary | ICD-10-CM | POA: Diagnosis not present

## 2023-04-22 DIAGNOSIS — R451 Restlessness and agitation: Secondary | ICD-10-CM | POA: Diagnosis not present

## 2023-05-01 DIAGNOSIS — R531 Weakness: Secondary | ICD-10-CM | POA: Diagnosis not present

## 2023-05-01 DIAGNOSIS — S199XXA Unspecified injury of neck, initial encounter: Secondary | ICD-10-CM | POA: Diagnosis not present

## 2023-05-01 DIAGNOSIS — S0081XA Abrasion of other part of head, initial encounter: Secondary | ICD-10-CM | POA: Diagnosis not present

## 2023-05-01 DIAGNOSIS — S0990XA Unspecified injury of head, initial encounter: Secondary | ICD-10-CM | POA: Diagnosis not present

## 2023-05-01 DIAGNOSIS — Z23 Encounter for immunization: Secondary | ICD-10-CM | POA: Diagnosis not present

## 2023-05-01 DIAGNOSIS — I6782 Cerebral ischemia: Secondary | ICD-10-CM | POA: Diagnosis not present

## 2023-05-01 DIAGNOSIS — W19XXXA Unspecified fall, initial encounter: Secondary | ICD-10-CM | POA: Diagnosis not present

## 2023-05-01 DIAGNOSIS — Z7401 Bed confinement status: Secondary | ICD-10-CM | POA: Diagnosis not present

## 2023-05-01 DIAGNOSIS — R220 Localized swelling, mass and lump, head: Secondary | ICD-10-CM | POA: Diagnosis not present

## 2023-05-06 DIAGNOSIS — I10 Essential (primary) hypertension: Secondary | ICD-10-CM | POA: Diagnosis not present

## 2023-05-06 DIAGNOSIS — E559 Vitamin D deficiency, unspecified: Secondary | ICD-10-CM | POA: Diagnosis not present

## 2023-05-06 DIAGNOSIS — I70223 Atherosclerosis of native arteries of extremities with rest pain, bilateral legs: Secondary | ICD-10-CM | POA: Diagnosis not present

## 2023-05-06 DIAGNOSIS — D518 Other vitamin B12 deficiency anemias: Secondary | ICD-10-CM | POA: Diagnosis not present

## 2023-05-06 DIAGNOSIS — E7849 Other hyperlipidemia: Secondary | ICD-10-CM | POA: Diagnosis not present

## 2023-05-06 DIAGNOSIS — E119 Type 2 diabetes mellitus without complications: Secondary | ICD-10-CM | POA: Diagnosis not present

## 2023-05-06 DIAGNOSIS — M810 Age-related osteoporosis without current pathological fracture: Secondary | ICD-10-CM | POA: Diagnosis not present

## 2023-05-06 DIAGNOSIS — E038 Other specified hypothyroidism: Secondary | ICD-10-CM | POA: Diagnosis not present

## 2023-05-18 DIAGNOSIS — I10 Essential (primary) hypertension: Secondary | ICD-10-CM | POA: Diagnosis not present

## 2023-05-22 DIAGNOSIS — M810 Age-related osteoporosis without current pathological fracture: Secondary | ICD-10-CM | POA: Diagnosis not present

## 2023-05-22 DIAGNOSIS — K219 Gastro-esophageal reflux disease without esophagitis: Secondary | ICD-10-CM | POA: Diagnosis not present

## 2023-05-22 DIAGNOSIS — I10 Essential (primary) hypertension: Secondary | ICD-10-CM | POA: Diagnosis not present

## 2023-05-22 DIAGNOSIS — E7800 Pure hypercholesterolemia, unspecified: Secondary | ICD-10-CM | POA: Diagnosis not present

## 2023-05-22 DIAGNOSIS — R202 Paresthesia of skin: Secondary | ICD-10-CM | POA: Diagnosis not present

## 2023-05-22 DIAGNOSIS — N1831 Chronic kidney disease, stage 3a: Secondary | ICD-10-CM | POA: Diagnosis not present

## 2023-05-22 DIAGNOSIS — E559 Vitamin D deficiency, unspecified: Secondary | ICD-10-CM | POA: Diagnosis not present

## 2023-06-03 DIAGNOSIS — Z79899 Other long term (current) drug therapy: Secondary | ICD-10-CM | POA: Diagnosis not present

## 2023-06-03 DIAGNOSIS — R451 Restlessness and agitation: Secondary | ICD-10-CM | POA: Diagnosis not present

## 2023-06-09 DIAGNOSIS — I70223 Atherosclerosis of native arteries of extremities with rest pain, bilateral legs: Secondary | ICD-10-CM | POA: Diagnosis not present

## 2023-06-09 DIAGNOSIS — E782 Mixed hyperlipidemia: Secondary | ICD-10-CM | POA: Diagnosis not present

## 2023-06-09 DIAGNOSIS — I10 Essential (primary) hypertension: Secondary | ICD-10-CM | POA: Diagnosis not present

## 2023-06-09 DIAGNOSIS — M810 Age-related osteoporosis without current pathological fracture: Secondary | ICD-10-CM | POA: Diagnosis not present

## 2023-06-09 DIAGNOSIS — E038 Other specified hypothyroidism: Secondary | ICD-10-CM | POA: Diagnosis not present

## 2023-06-09 DIAGNOSIS — E559 Vitamin D deficiency, unspecified: Secondary | ICD-10-CM | POA: Diagnosis not present

## 2023-06-09 DIAGNOSIS — E7849 Other hyperlipidemia: Secondary | ICD-10-CM | POA: Diagnosis not present

## 2023-06-09 DIAGNOSIS — D519 Vitamin B12 deficiency anemia, unspecified: Secondary | ICD-10-CM | POA: Diagnosis not present

## 2023-06-09 DIAGNOSIS — E119 Type 2 diabetes mellitus without complications: Secondary | ICD-10-CM | POA: Diagnosis not present

## 2023-06-16 DIAGNOSIS — I10 Essential (primary) hypertension: Secondary | ICD-10-CM | POA: Diagnosis not present

## 2023-06-17 DIAGNOSIS — I10 Essential (primary) hypertension: Secondary | ICD-10-CM | POA: Diagnosis not present

## 2023-06-17 DIAGNOSIS — E559 Vitamin D deficiency, unspecified: Secondary | ICD-10-CM | POA: Diagnosis not present

## 2023-06-17 DIAGNOSIS — K219 Gastro-esophageal reflux disease without esophagitis: Secondary | ICD-10-CM | POA: Diagnosis not present

## 2023-06-17 DIAGNOSIS — M810 Age-related osteoporosis without current pathological fracture: Secondary | ICD-10-CM | POA: Diagnosis not present

## 2023-06-17 DIAGNOSIS — N1831 Chronic kidney disease, stage 3a: Secondary | ICD-10-CM | POA: Diagnosis not present

## 2023-07-06 DIAGNOSIS — W19XXXA Unspecified fall, initial encounter: Secondary | ICD-10-CM | POA: Diagnosis not present

## 2023-07-06 DIAGNOSIS — I10 Essential (primary) hypertension: Secondary | ICD-10-CM | POA: Diagnosis not present

## 2023-07-06 DIAGNOSIS — S0990XA Unspecified injury of head, initial encounter: Secondary | ICD-10-CM | POA: Diagnosis not present

## 2023-07-06 DIAGNOSIS — R296 Repeated falls: Secondary | ICD-10-CM | POA: Diagnosis not present

## 2023-07-06 DIAGNOSIS — S161XXA Strain of muscle, fascia and tendon at neck level, initial encounter: Secondary | ICD-10-CM | POA: Diagnosis not present

## 2023-07-07 DIAGNOSIS — Z743 Need for continuous supervision: Secondary | ICD-10-CM | POA: Diagnosis not present

## 2023-07-07 DIAGNOSIS — R6889 Other general symptoms and signs: Secondary | ICD-10-CM | POA: Diagnosis not present

## 2023-07-07 DIAGNOSIS — W19XXXA Unspecified fall, initial encounter: Secondary | ICD-10-CM | POA: Diagnosis not present

## 2023-07-08 DIAGNOSIS — M810 Age-related osteoporosis without current pathological fracture: Secondary | ICD-10-CM | POA: Diagnosis not present

## 2023-07-08 DIAGNOSIS — E119 Type 2 diabetes mellitus without complications: Secondary | ICD-10-CM | POA: Diagnosis not present

## 2023-07-08 DIAGNOSIS — I10 Essential (primary) hypertension: Secondary | ICD-10-CM | POA: Diagnosis not present

## 2023-07-08 DIAGNOSIS — E038 Other specified hypothyroidism: Secondary | ICD-10-CM | POA: Diagnosis not present

## 2023-07-08 DIAGNOSIS — E559 Vitamin D deficiency, unspecified: Secondary | ICD-10-CM | POA: Diagnosis not present

## 2023-07-08 DIAGNOSIS — E7849 Other hyperlipidemia: Secondary | ICD-10-CM | POA: Diagnosis not present

## 2023-07-08 DIAGNOSIS — I70223 Atherosclerosis of native arteries of extremities with rest pain, bilateral legs: Secondary | ICD-10-CM | POA: Diagnosis not present

## 2023-07-08 DIAGNOSIS — E039 Hypothyroidism, unspecified: Secondary | ICD-10-CM | POA: Diagnosis not present

## 2023-07-08 DIAGNOSIS — E782 Mixed hyperlipidemia: Secondary | ICD-10-CM | POA: Diagnosis not present

## 2023-07-09 DIAGNOSIS — Z79899 Other long term (current) drug therapy: Secondary | ICD-10-CM | POA: Diagnosis not present

## 2023-07-09 DIAGNOSIS — R451 Restlessness and agitation: Secondary | ICD-10-CM | POA: Diagnosis not present

## 2023-07-15 DIAGNOSIS — R296 Repeated falls: Secondary | ICD-10-CM | POA: Diagnosis not present

## 2023-07-15 DIAGNOSIS — S161XXA Strain of muscle, fascia and tendon at neck level, initial encounter: Secondary | ICD-10-CM | POA: Diagnosis not present

## 2023-07-17 DIAGNOSIS — I10 Essential (primary) hypertension: Secondary | ICD-10-CM | POA: Diagnosis not present

## 2023-07-22 DIAGNOSIS — B351 Tinea unguium: Secondary | ICD-10-CM | POA: Diagnosis not present

## 2023-07-22 DIAGNOSIS — M6281 Muscle weakness (generalized): Secondary | ICD-10-CM | POA: Diagnosis not present

## 2023-07-22 DIAGNOSIS — R2689 Other abnormalities of gait and mobility: Secondary | ICD-10-CM | POA: Diagnosis not present

## 2023-07-22 DIAGNOSIS — R262 Difficulty in walking, not elsewhere classified: Secondary | ICD-10-CM | POA: Diagnosis not present

## 2023-07-22 DIAGNOSIS — I872 Venous insufficiency (chronic) (peripheral): Secondary | ICD-10-CM | POA: Diagnosis not present

## 2023-07-29 DIAGNOSIS — R451 Restlessness and agitation: Secondary | ICD-10-CM | POA: Diagnosis not present

## 2023-07-29 DIAGNOSIS — Z79899 Other long term (current) drug therapy: Secondary | ICD-10-CM | POA: Diagnosis not present

## 2023-08-09 DIAGNOSIS — E559 Vitamin D deficiency, unspecified: Secondary | ICD-10-CM | POA: Diagnosis not present

## 2023-08-09 DIAGNOSIS — M810 Age-related osteoporosis without current pathological fracture: Secondary | ICD-10-CM | POA: Diagnosis not present

## 2023-08-09 DIAGNOSIS — I10 Essential (primary) hypertension: Secondary | ICD-10-CM | POA: Diagnosis not present

## 2023-08-09 DIAGNOSIS — Z0000 Encounter for general adult medical examination without abnormal findings: Secondary | ICD-10-CM | POA: Diagnosis not present

## 2023-08-09 DIAGNOSIS — K219 Gastro-esophageal reflux disease without esophagitis: Secondary | ICD-10-CM | POA: Diagnosis not present

## 2023-08-09 DIAGNOSIS — N1831 Chronic kidney disease, stage 3a: Secondary | ICD-10-CM | POA: Diagnosis not present

## 2023-08-12 DIAGNOSIS — K219 Gastro-esophageal reflux disease without esophagitis: Secondary | ICD-10-CM | POA: Diagnosis not present

## 2023-08-12 DIAGNOSIS — I10 Essential (primary) hypertension: Secondary | ICD-10-CM | POA: Diagnosis not present

## 2023-08-12 DIAGNOSIS — R451 Restlessness and agitation: Secondary | ICD-10-CM | POA: Diagnosis not present

## 2023-08-12 DIAGNOSIS — Z79899 Other long term (current) drug therapy: Secondary | ICD-10-CM | POA: Diagnosis not present

## 2023-08-12 DIAGNOSIS — N1831 Chronic kidney disease, stage 3a: Secondary | ICD-10-CM | POA: Diagnosis not present

## 2023-08-12 DIAGNOSIS — E559 Vitamin D deficiency, unspecified: Secondary | ICD-10-CM | POA: Diagnosis not present

## 2023-08-17 DIAGNOSIS — E7849 Other hyperlipidemia: Secondary | ICD-10-CM | POA: Diagnosis not present

## 2023-08-17 DIAGNOSIS — I10 Essential (primary) hypertension: Secondary | ICD-10-CM | POA: Diagnosis not present

## 2023-08-17 DIAGNOSIS — E7800 Pure hypercholesterolemia, unspecified: Secondary | ICD-10-CM | POA: Diagnosis not present

## 2023-08-17 DIAGNOSIS — E782 Mixed hyperlipidemia: Secondary | ICD-10-CM | POA: Diagnosis not present

## 2023-08-17 DIAGNOSIS — M810 Age-related osteoporosis without current pathological fracture: Secondary | ICD-10-CM | POA: Diagnosis not present

## 2023-08-17 DIAGNOSIS — E119 Type 2 diabetes mellitus without complications: Secondary | ICD-10-CM | POA: Diagnosis not present

## 2023-08-17 DIAGNOSIS — I70223 Atherosclerosis of native arteries of extremities with rest pain, bilateral legs: Secondary | ICD-10-CM | POA: Diagnosis not present

## 2023-08-17 DIAGNOSIS — E038 Other specified hypothyroidism: Secondary | ICD-10-CM | POA: Diagnosis not present

## 2023-08-17 DIAGNOSIS — E559 Vitamin D deficiency, unspecified: Secondary | ICD-10-CM | POA: Diagnosis not present

## 2023-08-31 DIAGNOSIS — R7309 Other abnormal glucose: Secondary | ICD-10-CM | POA: Diagnosis not present

## 2023-08-31 DIAGNOSIS — E038 Other specified hypothyroidism: Secondary | ICD-10-CM | POA: Diagnosis not present

## 2023-08-31 DIAGNOSIS — E782 Mixed hyperlipidemia: Secondary | ICD-10-CM | POA: Diagnosis not present

## 2023-08-31 DIAGNOSIS — Z79899 Other long term (current) drug therapy: Secondary | ICD-10-CM | POA: Diagnosis not present

## 2023-08-31 DIAGNOSIS — D519 Vitamin B12 deficiency anemia, unspecified: Secondary | ICD-10-CM | POA: Diagnosis not present

## 2023-09-02 DIAGNOSIS — E119 Type 2 diabetes mellitus without complications: Secondary | ICD-10-CM | POA: Diagnosis not present

## 2023-09-02 DIAGNOSIS — E038 Other specified hypothyroidism: Secondary | ICD-10-CM | POA: Diagnosis not present

## 2023-09-02 DIAGNOSIS — M810 Age-related osteoporosis without current pathological fracture: Secondary | ICD-10-CM | POA: Diagnosis not present

## 2023-09-02 DIAGNOSIS — E782 Mixed hyperlipidemia: Secondary | ICD-10-CM | POA: Diagnosis not present

## 2023-09-02 DIAGNOSIS — E7800 Pure hypercholesterolemia, unspecified: Secondary | ICD-10-CM | POA: Diagnosis not present

## 2023-09-02 DIAGNOSIS — I70223 Atherosclerosis of native arteries of extremities with rest pain, bilateral legs: Secondary | ICD-10-CM | POA: Diagnosis not present

## 2023-09-02 DIAGNOSIS — E7849 Other hyperlipidemia: Secondary | ICD-10-CM | POA: Diagnosis not present

## 2023-09-02 DIAGNOSIS — E559 Vitamin D deficiency, unspecified: Secondary | ICD-10-CM | POA: Diagnosis not present

## 2023-09-02 DIAGNOSIS — I10 Essential (primary) hypertension: Secondary | ICD-10-CM | POA: Diagnosis not present

## 2023-09-09 DIAGNOSIS — K219 Gastro-esophageal reflux disease without esophagitis: Secondary | ICD-10-CM | POA: Diagnosis not present

## 2023-09-09 DIAGNOSIS — I10 Essential (primary) hypertension: Secondary | ICD-10-CM | POA: Diagnosis not present

## 2023-09-09 DIAGNOSIS — N1832 Chronic kidney disease, stage 3b: Secondary | ICD-10-CM | POA: Diagnosis not present

## 2023-09-09 DIAGNOSIS — M810 Age-related osteoporosis without current pathological fracture: Secondary | ICD-10-CM | POA: Diagnosis not present

## 2023-09-09 DIAGNOSIS — E559 Vitamin D deficiency, unspecified: Secondary | ICD-10-CM | POA: Diagnosis not present

## 2023-09-16 DIAGNOSIS — L729 Follicular cyst of the skin and subcutaneous tissue, unspecified: Secondary | ICD-10-CM | POA: Diagnosis not present

## 2023-09-23 DIAGNOSIS — R262 Difficulty in walking, not elsewhere classified: Secondary | ICD-10-CM | POA: Diagnosis not present

## 2023-09-23 DIAGNOSIS — R238 Other skin changes: Secondary | ICD-10-CM | POA: Diagnosis not present

## 2023-09-23 DIAGNOSIS — R2689 Other abnormalities of gait and mobility: Secondary | ICD-10-CM | POA: Diagnosis not present

## 2023-09-23 DIAGNOSIS — B351 Tinea unguium: Secondary | ICD-10-CM | POA: Diagnosis not present

## 2023-09-23 DIAGNOSIS — I872 Venous insufficiency (chronic) (peripheral): Secondary | ICD-10-CM | POA: Diagnosis not present

## 2023-09-23 DIAGNOSIS — L600 Ingrowing nail: Secondary | ICD-10-CM | POA: Diagnosis not present

## 2023-09-23 DIAGNOSIS — M6281 Muscle weakness (generalized): Secondary | ICD-10-CM | POA: Diagnosis not present

## 2023-09-30 DIAGNOSIS — Z79899 Other long term (current) drug therapy: Secondary | ICD-10-CM | POA: Diagnosis not present

## 2023-10-06 DIAGNOSIS — I70223 Atherosclerosis of native arteries of extremities with rest pain, bilateral legs: Secondary | ICD-10-CM | POA: Diagnosis not present

## 2023-10-06 DIAGNOSIS — M810 Age-related osteoporosis without current pathological fracture: Secondary | ICD-10-CM | POA: Diagnosis not present

## 2023-10-06 DIAGNOSIS — E119 Type 2 diabetes mellitus without complications: Secondary | ICD-10-CM | POA: Diagnosis not present

## 2023-10-06 DIAGNOSIS — D519 Vitamin B12 deficiency anemia, unspecified: Secondary | ICD-10-CM | POA: Diagnosis not present

## 2023-10-06 DIAGNOSIS — E782 Mixed hyperlipidemia: Secondary | ICD-10-CM | POA: Diagnosis not present

## 2023-10-06 DIAGNOSIS — E7849 Other hyperlipidemia: Secondary | ICD-10-CM | POA: Diagnosis not present

## 2023-10-06 DIAGNOSIS — E038 Other specified hypothyroidism: Secondary | ICD-10-CM | POA: Diagnosis not present

## 2023-10-06 DIAGNOSIS — I10 Essential (primary) hypertension: Secondary | ICD-10-CM | POA: Diagnosis not present

## 2023-10-06 DIAGNOSIS — E559 Vitamin D deficiency, unspecified: Secondary | ICD-10-CM | POA: Diagnosis not present

## 2023-10-07 DIAGNOSIS — N1832 Chronic kidney disease, stage 3b: Secondary | ICD-10-CM | POA: Diagnosis not present

## 2023-10-07 DIAGNOSIS — R202 Paresthesia of skin: Secondary | ICD-10-CM | POA: Diagnosis not present

## 2023-10-07 DIAGNOSIS — I10 Essential (primary) hypertension: Secondary | ICD-10-CM | POA: Diagnosis not present

## 2023-10-07 DIAGNOSIS — L729 Follicular cyst of the skin and subcutaneous tissue, unspecified: Secondary | ICD-10-CM | POA: Diagnosis not present

## 2023-10-07 DIAGNOSIS — E559 Vitamin D deficiency, unspecified: Secondary | ICD-10-CM | POA: Diagnosis not present

## 2023-10-07 DIAGNOSIS — E7800 Pure hypercholesterolemia, unspecified: Secondary | ICD-10-CM | POA: Diagnosis not present

## 2023-10-07 DIAGNOSIS — M810 Age-related osteoporosis without current pathological fracture: Secondary | ICD-10-CM | POA: Diagnosis not present

## 2023-10-07 DIAGNOSIS — K219 Gastro-esophageal reflux disease without esophagitis: Secondary | ICD-10-CM | POA: Diagnosis not present

## 2023-11-02 DIAGNOSIS — L7211 Pilar cyst: Secondary | ICD-10-CM | POA: Diagnosis not present

## 2023-11-02 DIAGNOSIS — Z79899 Other long term (current) drug therapy: Secondary | ICD-10-CM | POA: Diagnosis not present

## 2023-11-02 DIAGNOSIS — E782 Mixed hyperlipidemia: Secondary | ICD-10-CM | POA: Diagnosis not present

## 2023-11-02 DIAGNOSIS — I10 Essential (primary) hypertension: Secondary | ICD-10-CM | POA: Diagnosis not present

## 2023-11-02 DIAGNOSIS — D519 Vitamin B12 deficiency anemia, unspecified: Secondary | ICD-10-CM | POA: Diagnosis not present

## 2023-11-02 DIAGNOSIS — M810 Age-related osteoporosis without current pathological fracture: Secondary | ICD-10-CM | POA: Diagnosis not present

## 2023-11-02 DIAGNOSIS — E038 Other specified hypothyroidism: Secondary | ICD-10-CM | POA: Diagnosis not present

## 2023-11-02 DIAGNOSIS — E7849 Other hyperlipidemia: Secondary | ICD-10-CM | POA: Diagnosis not present

## 2023-11-02 DIAGNOSIS — E119 Type 2 diabetes mellitus without complications: Secondary | ICD-10-CM | POA: Diagnosis not present

## 2023-11-02 DIAGNOSIS — I70223 Atherosclerosis of native arteries of extremities with rest pain, bilateral legs: Secondary | ICD-10-CM | POA: Diagnosis not present

## 2023-11-02 DIAGNOSIS — E559 Vitamin D deficiency, unspecified: Secondary | ICD-10-CM | POA: Diagnosis not present

## 2023-11-04 DIAGNOSIS — L729 Follicular cyst of the skin and subcutaneous tissue, unspecified: Secondary | ICD-10-CM | POA: Diagnosis not present

## 2023-11-04 DIAGNOSIS — K219 Gastro-esophageal reflux disease without esophagitis: Secondary | ICD-10-CM | POA: Diagnosis not present

## 2023-11-04 DIAGNOSIS — M810 Age-related osteoporosis without current pathological fracture: Secondary | ICD-10-CM | POA: Diagnosis not present

## 2023-11-04 DIAGNOSIS — N1832 Chronic kidney disease, stage 3b: Secondary | ICD-10-CM | POA: Diagnosis not present

## 2023-11-04 DIAGNOSIS — E7800 Pure hypercholesterolemia, unspecified: Secondary | ICD-10-CM | POA: Diagnosis not present

## 2023-11-04 DIAGNOSIS — E559 Vitamin D deficiency, unspecified: Secondary | ICD-10-CM | POA: Diagnosis not present

## 2023-11-11 DIAGNOSIS — L7211 Pilar cyst: Secondary | ICD-10-CM | POA: Diagnosis not present

## 2023-11-14 DIAGNOSIS — E7800 Pure hypercholesterolemia, unspecified: Secondary | ICD-10-CM | POA: Diagnosis not present

## 2023-11-14 DIAGNOSIS — K219 Gastro-esophageal reflux disease without esophagitis: Secondary | ICD-10-CM | POA: Diagnosis not present

## 2023-11-14 DIAGNOSIS — I10 Essential (primary) hypertension: Secondary | ICD-10-CM | POA: Diagnosis not present

## 2023-11-14 DIAGNOSIS — N1832 Chronic kidney disease, stage 3b: Secondary | ICD-10-CM | POA: Diagnosis not present

## 2023-11-14 DIAGNOSIS — E559 Vitamin D deficiency, unspecified: Secondary | ICD-10-CM | POA: Diagnosis not present

## 2023-11-25 DIAGNOSIS — R451 Restlessness and agitation: Secondary | ICD-10-CM | POA: Diagnosis not present

## 2023-11-25 DIAGNOSIS — Z79899 Other long term (current) drug therapy: Secondary | ICD-10-CM | POA: Diagnosis not present

## 2023-11-30 DIAGNOSIS — D519 Vitamin B12 deficiency anemia, unspecified: Secondary | ICD-10-CM | POA: Diagnosis not present

## 2023-11-30 DIAGNOSIS — E038 Other specified hypothyroidism: Secondary | ICD-10-CM | POA: Diagnosis not present

## 2023-11-30 DIAGNOSIS — E782 Mixed hyperlipidemia: Secondary | ICD-10-CM | POA: Diagnosis not present

## 2023-11-30 DIAGNOSIS — Z79899 Other long term (current) drug therapy: Secondary | ICD-10-CM | POA: Diagnosis not present

## 2023-11-30 DIAGNOSIS — R7309 Other abnormal glucose: Secondary | ICD-10-CM | POA: Diagnosis not present

## 2023-12-02 DIAGNOSIS — M810 Age-related osteoporosis without current pathological fracture: Secondary | ICD-10-CM | POA: Diagnosis not present

## 2023-12-02 DIAGNOSIS — N182 Chronic kidney disease, stage 2 (mild): Secondary | ICD-10-CM | POA: Diagnosis not present

## 2023-12-02 DIAGNOSIS — I10 Essential (primary) hypertension: Secondary | ICD-10-CM | POA: Diagnosis not present

## 2023-12-02 DIAGNOSIS — K219 Gastro-esophageal reflux disease without esophagitis: Secondary | ICD-10-CM | POA: Diagnosis not present

## 2023-12-04 DIAGNOSIS — E039 Hypothyroidism, unspecified: Secondary | ICD-10-CM | POA: Diagnosis not present

## 2023-12-04 DIAGNOSIS — D519 Vitamin B12 deficiency anemia, unspecified: Secondary | ICD-10-CM | POA: Diagnosis not present

## 2023-12-04 DIAGNOSIS — E559 Vitamin D deficiency, unspecified: Secondary | ICD-10-CM | POA: Diagnosis not present

## 2023-12-04 DIAGNOSIS — E7849 Other hyperlipidemia: Secondary | ICD-10-CM | POA: Diagnosis not present

## 2023-12-04 DIAGNOSIS — E782 Mixed hyperlipidemia: Secondary | ICD-10-CM | POA: Diagnosis not present

## 2023-12-04 DIAGNOSIS — E7800 Pure hypercholesterolemia, unspecified: Secondary | ICD-10-CM | POA: Diagnosis not present

## 2023-12-04 DIAGNOSIS — E038 Other specified hypothyroidism: Secondary | ICD-10-CM | POA: Diagnosis not present

## 2023-12-04 DIAGNOSIS — E119 Type 2 diabetes mellitus without complications: Secondary | ICD-10-CM | POA: Diagnosis not present

## 2023-12-04 DIAGNOSIS — I10 Essential (primary) hypertension: Secondary | ICD-10-CM | POA: Diagnosis not present

## 2023-12-04 DIAGNOSIS — M810 Age-related osteoporosis without current pathological fracture: Secondary | ICD-10-CM | POA: Diagnosis not present

## 2023-12-30 DIAGNOSIS — R601 Generalized edema: Secondary | ICD-10-CM | POA: Diagnosis not present

## 2023-12-30 DIAGNOSIS — R262 Difficulty in walking, not elsewhere classified: Secondary | ICD-10-CM | POA: Diagnosis not present

## 2023-12-30 DIAGNOSIS — I739 Peripheral vascular disease, unspecified: Secondary | ICD-10-CM | POA: Diagnosis not present

## 2023-12-30 DIAGNOSIS — I872 Venous insufficiency (chronic) (peripheral): Secondary | ICD-10-CM | POA: Diagnosis not present

## 2023-12-30 DIAGNOSIS — R238 Other skin changes: Secondary | ICD-10-CM | POA: Diagnosis not present

## 2023-12-30 DIAGNOSIS — M6281 Muscle weakness (generalized): Secondary | ICD-10-CM | POA: Diagnosis not present

## 2023-12-30 DIAGNOSIS — B351 Tinea unguium: Secondary | ICD-10-CM | POA: Diagnosis not present

## 2024-01-01 DIAGNOSIS — E038 Other specified hypothyroidism: Secondary | ICD-10-CM | POA: Diagnosis not present

## 2024-01-01 DIAGNOSIS — E782 Mixed hyperlipidemia: Secondary | ICD-10-CM | POA: Diagnosis not present

## 2024-01-01 DIAGNOSIS — D519 Vitamin B12 deficiency anemia, unspecified: Secondary | ICD-10-CM | POA: Diagnosis not present

## 2024-01-01 DIAGNOSIS — M810 Age-related osteoporosis without current pathological fracture: Secondary | ICD-10-CM | POA: Diagnosis not present

## 2024-01-01 DIAGNOSIS — E559 Vitamin D deficiency, unspecified: Secondary | ICD-10-CM | POA: Diagnosis not present

## 2024-01-01 DIAGNOSIS — E119 Type 2 diabetes mellitus without complications: Secondary | ICD-10-CM | POA: Diagnosis not present

## 2024-01-01 DIAGNOSIS — E7849 Other hyperlipidemia: Secondary | ICD-10-CM | POA: Diagnosis not present

## 2024-01-01 DIAGNOSIS — I10 Essential (primary) hypertension: Secondary | ICD-10-CM | POA: Diagnosis not present

## 2024-01-06 DIAGNOSIS — I10 Essential (primary) hypertension: Secondary | ICD-10-CM | POA: Diagnosis not present

## 2024-01-17 DIAGNOSIS — I10 Essential (primary) hypertension: Secondary | ICD-10-CM | POA: Diagnosis not present

## 2024-01-17 DIAGNOSIS — K219 Gastro-esophageal reflux disease without esophagitis: Secondary | ICD-10-CM | POA: Diagnosis not present

## 2024-01-17 DIAGNOSIS — N182 Chronic kidney disease, stage 2 (mild): Secondary | ICD-10-CM | POA: Diagnosis not present

## 2024-02-03 DIAGNOSIS — M810 Age-related osteoporosis without current pathological fracture: Secondary | ICD-10-CM | POA: Diagnosis not present

## 2024-02-03 DIAGNOSIS — E782 Mixed hyperlipidemia: Secondary | ICD-10-CM | POA: Diagnosis not present

## 2024-02-03 DIAGNOSIS — I10 Essential (primary) hypertension: Secondary | ICD-10-CM | POA: Diagnosis not present

## 2024-02-03 DIAGNOSIS — E7849 Other hyperlipidemia: Secondary | ICD-10-CM | POA: Diagnosis not present

## 2024-02-03 DIAGNOSIS — E559 Vitamin D deficiency, unspecified: Secondary | ICD-10-CM | POA: Diagnosis not present

## 2024-02-03 DIAGNOSIS — E038 Other specified hypothyroidism: Secondary | ICD-10-CM | POA: Diagnosis not present

## 2024-02-03 DIAGNOSIS — I70223 Atherosclerosis of native arteries of extremities with rest pain, bilateral legs: Secondary | ICD-10-CM | POA: Diagnosis not present

## 2024-02-03 DIAGNOSIS — E119 Type 2 diabetes mellitus without complications: Secondary | ICD-10-CM | POA: Diagnosis not present

## 2024-02-03 DIAGNOSIS — E7800 Pure hypercholesterolemia, unspecified: Secondary | ICD-10-CM | POA: Diagnosis not present

## 2024-02-10 ENCOUNTER — Telehealth: Payer: Self-pay

## 2024-02-10 DIAGNOSIS — I10 Essential (primary) hypertension: Secondary | ICD-10-CM | POA: Diagnosis not present

## 2024-02-10 DIAGNOSIS — R5381 Other malaise: Secondary | ICD-10-CM | POA: Diagnosis not present

## 2024-02-10 NOTE — Telephone Encounter (Signed)
 Patient was identified as falling into the [place] Measure - Diabetes.   Patient was: Patient is not currently using our practice.

## 2024-02-11 DIAGNOSIS — R2689 Other abnormalities of gait and mobility: Secondary | ICD-10-CM | POA: Diagnosis not present

## 2024-02-11 DIAGNOSIS — M6281 Muscle weakness (generalized): Secondary | ICD-10-CM | POA: Diagnosis not present

## 2024-02-12 DIAGNOSIS — R278 Other lack of coordination: Secondary | ICD-10-CM | POA: Diagnosis not present

## 2024-02-12 DIAGNOSIS — M6281 Muscle weakness (generalized): Secondary | ICD-10-CM | POA: Diagnosis not present

## 2024-02-14 DIAGNOSIS — R2689 Other abnormalities of gait and mobility: Secondary | ICD-10-CM | POA: Diagnosis not present

## 2024-02-14 DIAGNOSIS — M6281 Muscle weakness (generalized): Secondary | ICD-10-CM | POA: Diagnosis not present

## 2024-02-16 DIAGNOSIS — R2689 Other abnormalities of gait and mobility: Secondary | ICD-10-CM | POA: Diagnosis not present

## 2024-02-16 DIAGNOSIS — M6281 Muscle weakness (generalized): Secondary | ICD-10-CM | POA: Diagnosis not present
